# Patient Record
Sex: Female | Born: 1955 | Race: Asian | Hispanic: No | Marital: Married
Health system: Midwestern US, Community
[De-identification: ages and names within clinical notes are randomized; demographics above are authoritative.]

## PROBLEM LIST (undated history)

## (undated) DIAGNOSIS — E785 Hyperlipidemia, unspecified: Secondary | ICD-10-CM

## (undated) DIAGNOSIS — M8580 Other specified disorders of bone density and structure, unspecified site: Secondary | ICD-10-CM

## (undated) DIAGNOSIS — E559 Vitamin D deficiency, unspecified: Secondary | ICD-10-CM

## (undated) DIAGNOSIS — B181 Chronic viral hepatitis B without delta-agent: Secondary | ICD-10-CM

## (undated) DIAGNOSIS — M810 Age-related osteoporosis without current pathological fracture: Secondary | ICD-10-CM

## (undated) DIAGNOSIS — K643 Fourth degree hemorrhoids: Secondary | ICD-10-CM

## (undated) DIAGNOSIS — Z13820 Encounter for screening for osteoporosis: Secondary | ICD-10-CM

## (undated) DIAGNOSIS — Z1231 Encounter for screening mammogram for malignant neoplasm of breast: Secondary | ICD-10-CM

## (undated) DIAGNOSIS — Z01818 Encounter for other preprocedural examination: Secondary | ICD-10-CM

## (undated) DIAGNOSIS — N200 Calculus of kidney: Secondary | ICD-10-CM

## (undated) DIAGNOSIS — Z01810 Encounter for preprocedural cardiovascular examination: Secondary | ICD-10-CM

## (undated) DIAGNOSIS — Z1211 Encounter for screening for malignant neoplasm of colon: Secondary | ICD-10-CM

## (undated) DIAGNOSIS — N201 Calculus of ureter: Secondary | ICD-10-CM

## (undated) DIAGNOSIS — R319 Hematuria, unspecified: Secondary | ICD-10-CM

## (undated) DIAGNOSIS — R3129 Other microscopic hematuria: Secondary | ICD-10-CM

## (undated) DIAGNOSIS — R7989 Other specified abnormal findings of blood chemistry: Secondary | ICD-10-CM

## (undated) HISTORY — PX: BREAST LUMPECTOMY: SHX2

## (undated) HISTORY — PX: CHOLECYSTECTOMY, LAPAROSCOPIC: SHX56

## (undated) HISTORY — PX: ABDOMINAL HYSTERECTOMY: SHX81

## (undated) HISTORY — DX: Hyperlipidemia, unspecified: E78.5

## (undated) HISTORY — PX: CATARACT EXTRACTION: SUR2

## (undated) HISTORY — DX: Vitamin D deficiency, unspecified: E55.9

## (undated) HISTORY — DX: Chronic viral hepatitis B without delta-agent: B18.1

## (undated) HISTORY — DX: Other specified disorders of bone density and structure, unspecified site: M85.80

## (undated) HISTORY — PX: OTHER SURGICAL HISTORY: SHX169

---

## 2004-03-30 HISTORY — PX: COLONOSCOPY: SHX174

## 2008-04-30 LAB — LIPID PANEL
CHOL/HDL Ratio: 3 (ref 0–5.0)
Cholesterol, total: 188 MG/DL (ref 0–200)
HDL Cholesterol: 62 MG/DL — ABNORMAL HIGH (ref 40–60)
LDL, calculated: 93.4 MG/DL (ref 0–100)
LDL/HDL Ratio: 1.5
Triglyceride: 163 MG/DL — ABNORMAL HIGH (ref 0–150)
VLDL, calculated: 32.6 MG/DL

## 2008-04-30 LAB — METABOLIC PANEL, BASIC
Anion gap: 7 mmol/L (ref 5–15)
BUN/Creatinine ratio: 21 — ABNORMAL HIGH (ref 12–20)
BUN: 17 MG/DL (ref 7–18)
CO2: 29 MMOL/L (ref 21–32)
Calcium: 9.4 MG/DL (ref 8.4–10.4)
Chloride: 105 MMOL/L (ref 100–108)
Creatinine: 0.8 MG/DL (ref 0.6–1.3)
GFR est AA: >60 mL/min/{1.73_m2} (ref >60)
GFR est non-AA: >60 mL/min/{1.73_m2} (ref >60)
Glucose: 102 MG/DL — ABNORMAL HIGH (ref 74–99)
Potassium: 4.5 MMOL/L (ref 3.5–5.5)
Sodium: 141 MMOL/L (ref 136–145)

## 2008-04-30 LAB — HEPATIC FUNCTION PANEL
A-G Ratio: 1.1 (ref 0.8–1.7)
ALT (SGPT): 58 U/L (ref 30–65)
AST (SGOT): 28 U/L (ref 15–37)
Albumin: 4.2 g/dL (ref 3.4–5.0)
Alk. phosphatase: 71 U/L (ref 50–136)
Bilirubin, direct: 0.1 MG/DL (ref 0.0–0.3)
Bilirubin, total: 0.6 MG/DL (ref 0.1–0.9)
Globulin: 3.7 g/dL (ref 2.0–4.0)
Protein, total: 7.9 g/dL (ref 6.4–8.2)

## 2008-05-01 LAB — CBC WITH AUTOMATED DIFF
ABS. LYMPHOCYTES: 1.2 10*3/uL (ref 0.8–3.5)
ABS. MONOCYTES: 0.3 10*3/uL (ref 0–1.0)
ABS. NEUTROPHILS: 3.7 10*3/uL (ref 1.8–8.0)
BASOPHILS: 0 % (ref 0–3)
EOSINOPHILS: 1 % (ref 0–5)
HCT: 38.4 % (ref 36.0–46.0)
HGB: 12.3 g/dL (ref 12.0–16.0)
LYMPHOCYTES: 23 % (ref 20–51)
MCH: 30.7 PG (ref 25.0–35.0)
MCHC: 31.9 g/dL (ref 31.0–37.0)
MCV: 96 FL (ref 78.0–102.0)
MONOCYTES: 5 % (ref 2–9)
MPV: 7.7 FL (ref 7.4–10.4)
NEUTROPHILS: 71 % (ref 42–75)
PLATELET: 251 10*3/uL (ref 130–400)
RBC: 3.99 M/uL — ABNORMAL LOW (ref 4.10–5.10)
RDW: 13.5 % (ref 11.5–14.5)
WBC: 5.2 10*3/uL (ref 4.5–13.0)

## 2008-05-02 LAB — VITAMIN D, 25 HYDROXY: Vitamin D 25-Hydroxy: 30 ng/mL (ref 30–80)

## 2008-09-08 LAB — METABOLIC PANEL, COMPREHENSIVE
A-G Ratio: 1.2 (ref 0.8–1.7)
ALT (SGPT): 41 U/L (ref 30–65)
AST (SGOT): 21 U/L (ref 15–37)
Albumin: 4.4 g/dL (ref 3.4–5.0)
Alk. phosphatase: 76 U/L (ref 50–136)
Anion gap: 9 mmol/L (ref 5–15)
BUN/Creatinine ratio: 20 (ref 12–20)
BUN: 14 MG/DL (ref 7–18)
Bilirubin, total: 0.8 MG/DL (ref 0.1–0.9)
CO2: 30 MMOL/L (ref 21–32)
Calcium: 9 MG/DL (ref 8.4–10.4)
Chloride: 104 MMOL/L (ref 100–108)
Creatinine: 0.7 mg/dL (ref 0.6–1.3)
GFR est AA: >60 mL/min/{1.73_m2} (ref >60)
GFR est non-AA: >60 mL/min/{1.73_m2} (ref >60)
Globulin: 3.6 g/dL (ref 2.0–4.0)
Glucose: 99 MG/DL (ref 74–99)
Potassium: 5 MMOL/L (ref 3.5–5.5)
Protein, total: 8 g/dL (ref 6.4–8.2)
Sodium: 143 MMOL/L (ref 136–145)

## 2008-09-08 LAB — CBC WITH AUTOMATED DIFF
ABS. EOSINOPHILS: 0.1 10*3/uL (ref 0.0–0.4)
ABS. LYMPHOCYTES: 1.6 10*3/uL (ref 0.8–3.5)
ABS. MONOCYTES: 0.5 10*3/uL (ref 0–1.0)
ABS. NEUTROPHILS: 3.7 10*3/uL (ref 1.8–8.0)
BASOPHILS: 0 % (ref 0–3)
EOSINOPHILS: 2 % (ref 0–5)
HCT: 36.6 % (ref 36.0–46.0)
HGB: 12.5 g/dL (ref 12.0–16.0)
LYMPHOCYTES: 28 % (ref 20–51)
MCH: 31.8 PG (ref 25.0–35.0)
MCHC: 34.2 g/dL (ref 31.0–37.0)
MCV: 92.9 FL (ref 78.0–102.0)
MONOCYTES: 8 % (ref 2–9)
MPV: 7.4 FL (ref 7.4–10.4)
NEUTROPHILS: 62 % (ref 42–75)
PLATELET: 256 10*3/uL (ref 130–400)
RBC: 3.94 M/uL — ABNORMAL LOW (ref 4.10–5.10)
RDW: 13.2 % (ref 11.5–14.5)
WBC: 5.9 10*3/uL (ref 4.5–13.0)

## 2008-09-08 LAB — LIPID PANEL
CHOL/HDL Ratio: 3.1 (ref 0–5.0)
Cholesterol, total: 174 MG/DL (ref 0–200)
HDL Cholesterol: 57 MG/DL (ref 40–60)
LDL, calculated: 88.2 MG/DL (ref 0–100)
LDL/HDL Ratio: 1.5
Triglyceride: 144 MG/DL (ref 0–150)
VLDL, calculated: 28.8 MG/DL

## 2008-09-08 LAB — TSH 3RD GENERATION: TSH: 1.09 u[IU]/mL (ref 0.51–6.27)

## 2008-09-08 LAB — HEMOGLOBIN A1C WITH EAG: Hemoglobin A1c: 5.7 % (ref 4.8–6.0)

## 2008-09-09 LAB — VITAMIN D, 25 HYDROXY: Vitamin D 25-Hydroxy: 25 ng/mL — ABNORMAL LOW (ref 30–80)

## 2009-01-07 LAB — HEPATIC FUNCTION PANEL
A-G Ratio: 1.3 (ref 0.8–1.7)
ALT (SGPT): 38 U/L (ref 30–65)
AST (SGOT): 22 U/L (ref 15–37)
Albumin: 4.4 g/dL (ref 3.4–5.0)
Alk. phosphatase: 72 U/L (ref 50–136)
Bilirubin, direct: 0.2 MG/DL (ref 0.0–0.3)
Bilirubin, total: 1 MG/DL — ABNORMAL HIGH (ref 0.1–0.9)
Globulin: 3.5 g/dL (ref 2.0–4.0)
Protein, total: 7.9 g/dL (ref 6.4–8.2)

## 2009-01-07 LAB — METABOLIC PANEL, BASIC
Anion gap: 7 mmol/L (ref 5–15)
BUN/Creatinine ratio: 16 (ref 12–20)
BUN: 13 MG/DL (ref 7–18)
CO2: 30 MMOL/L (ref 21–32)
Calcium: 9.3 MG/DL (ref 8.4–10.4)
Chloride: 103 MMOL/L (ref 100–108)
Creatinine: 0.8 MG/DL (ref 0.6–1.3)
GFR est AA: >60 mL/min/{1.73_m2} (ref >60)
GFR est non-AA: >60 mL/min/{1.73_m2} (ref >60)
Glucose: 97 MG/DL (ref 74–99)
Potassium: 4.6 MMOL/L (ref 3.5–5.5)
Sodium: 140 MMOL/L (ref 136–145)

## 2009-01-07 LAB — LIPID PANEL
CHOL/HDL Ratio: 2.9 (ref 0–5.0)
Cholesterol, total: 170 MG/DL (ref 0–200)
HDL Cholesterol: 58 MG/DL (ref 40–60)
LDL, calculated: 89.6 MG/DL (ref 0–100)
LDL/HDL Ratio: 1.5
Triglyceride: 112 MG/DL (ref 0–150)
VLDL, calculated: 22.4 MG/DL

## 2010-03-30 HISTORY — PX: OTHER SURGICAL HISTORY: SHX169

## 2011-01-05 ENCOUNTER — Encounter

## 2014-01-05 ENCOUNTER — Encounter: Payer: PRIVATE HEALTH INSURANCE | Primary: Physician Assistant

## 2014-01-11 ENCOUNTER — Inpatient Hospital Stay: Admit: 2014-01-11 | Payer: PRIVATE HEALTH INSURANCE | Primary: Physician Assistant

## 2014-01-11 DIAGNOSIS — M25511 Pain in right shoulder: Secondary | ICD-10-CM

## 2014-01-11 NOTE — Progress Notes (Signed)
PHYSICAL THERAPY - DAILY TREATMENT NOTE  Patient Name: Darlene Espinoza        Date: 01/11/2014  DOB: [birth year]     Patient DOB Verified  Visit #:   1   of   6  Insurance: Payor: OPTIMA / Plan: [place] / Product Type: HMO /      In time:   2:00          Out time:   3:00 Total Treatment Time (min):   60   Medicare Time Tracking (below)   Total Timed Codes (min):  60 1:1 Treatment Time:  60     SUBJECTIVE    Pain Level (on 0 to 10 scale):  0  / 10   Medication Changes/New allergies or changes in medical history, any new surgeries or procedures?     No      Yes   If yes, update Summary List:    Subjective Functional Status/Changes:    No changes reported   HISTORY    Present Symptoms: R Shoulder pain x 3 months. X-rays (+) for OA. Went to specialist, who informed her that she has a frozen shoulder    Present since: 3 months   Improving   Unchanging   Worsening          Commenced as a result of:    or   No apparent reason    Symptoms at onset: Pain to top of shoulder    Constant symptoms: R shoulder discomfort, intolerance towards elevation.     Intermittent symptoms: sharp pain with end range     What produces or worsens: elevation    What stops or reduces: rest.     Continued use makes the pain:   Better   Worse   No effect     Disturbed night:  No     Yes    Pain at rest:  No     Yes     Not since shot  Treatments this episode: Received injection 12/25/13    Previous episodes: 2010, had a chopping board fall onto shoulder. Had on/off pain since to top of shoulder.     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     Leisure: Mechanical Stresses: sedentary, stationary bike, cooking.     Functional disability from present episode: intolerance towards elevation.     Functional disability score: See QD    Summary:   Acute   Sub-acute   Chronic       Trauma   Insidious onset        OBJECTIVE      Physical Therapy Evaluation - Shoulder    Posture:  Poor     Fair     Good    Describe:    ROM:   Unable to assess at this time                                           AROM  PROM   Left Right  Right   Flexion 160 110 Flexion 108   Extension 80 25 Extension 28   Scaption/ABD 158 55 Scaption/ABD 57   ER @ 0 Degrees 90 20  ER @ 0 Degrees 28   ER @ 90 Degrees 110 unable ER @ 90 Degrees NT   IR @ 90 Degrees 70 unable IR @ 90 Degrees NT   IR HBB T4 unable IR HBB NT     End Feel / Painful Arc: firm at end ranges        Strength:    Unable to assess at this time                                                                            L (1-5) R (1-5) Pain   Flexors 5   Yes    No   Abductors 5   Yes    No   External Rotators 5   Yes    No   Internal Rotators 5   Yes    No   Supraspinatus 5   Yes    No   Serratus Anterior 5   Yes    No   Lower Trapezius 5   Yes    No   Elbow Flexion 5   Yes    No   Elbow Extension 5   Yes    No       Scapulohumoral Control / Rhythm:poor    Accessory Motions: UT and trunk lean    Able to eccentrically lower with good control?       Palpation   Min   Mod   Severe    Location:supraspinatus   Min   Mod   Severe    Location:anterolateral shoulder   Min   Mod   Severe    Location:    Optional Tests:    Sensation Left Right Reflexes Left Right   Biceps (C5)   Biceps (C5)     Palmer Radial(C6-7)   Brachioradialis (C6)     Palmer Ulnar(C8-T1)   Triceps (C7)       Crossover Imp.  Pos    Neg  Yergason's Test  Pos    Neg  Roo's Test   Pos    Neg  Gilchrist's Sign  Pos    Neg  Neer's Test   Pos    Neg  Clunk Test   Pos    Neg  Hawkin's Test   Pos    Neg  AC Joint   Pos    Neg  Speed's Test   Pos    Neg SC Joint   Pos    Neg  Empty Can   Pos    Neg Pectoral Tightness  Pos    Neg  Anterior Apprehension  Pos    Neg   Posterior Apprehension  Pos    Neg    Other Tests / Comments:      Modality Rationale:       min  Estim, type/location:  att       unatt       w/US       w/ice      w/heat    min   Mechanical Traction: type/lbs                     pro     sup     int     cont      before manual      after manual    min   Ultrasound, settings/location:      min   Iontophoresis w/ dexamethasone, location:                                                 take home patch         in clinic   10 min   Ice       Heat    location/position: supine    min   Vasopneumatic Device, press/temp:     min   Other:     Skin assessment post-treatment (if applicable):      intact      redness- no adverse reaction     redness ??? adverse reaction:      23 min Therapeutic Exercise:    See flow sheet   Rationale:      increase ROM and increase strength to improve the patient???s return to function     10 min Manual Therapy: STM to periscapular mm; GH G3 GH Distraction; G3-4 inferior/posterior mobs;  PROM all planes; Scapular PNF; Shoulder PNF Patterns; Rhythmic Stabilization.      Rationale:      decrease pain, increase ROM and increase tissue extensibility to improve patient's return to function     min Patient Education:  YES  Reviewed HEP     Progressed/Changed HEP based on:        Other Objective/Functional Measures:    See above     Post Treatment Pain Level (on 0 to 10) scale:   0  / 10     ASSESSMENT    Assessment/Changes in Function:     Provisional Classification:      Peripheral   Spine     Dysfunction:   Articular: shoulder  Contractile:  Derangement:  Postural:  Uncertain:  Other:       See Progress Note/Recertification   Patient will continue to benefit from skilled PT services to modify and progress therapeutic interventions, address functional mobility deficits, address ROM deficits, address strength deficits, analyze and address soft tissue restrictions, analyze and cue movement patterns, analyze and modify  body mechanics/ergonomics and instruct in home and community integration to attain remaining goals.   Progress toward goals / Updated goals:    n/a     PLAN       Upgrade activities as tolerated YES Continue plan of care     Discharge due to :      Other: Initiate POC     Therapist: Quentin Ore, PT, Cert. MDT    Date: 01/11/2014 Time: 2:04 PM

## 2014-01-11 NOTE — Progress Notes (Signed)
[facility] ??? [facility]  [address], [place], [place] [id number] - Phone: [phone]  Fax: [phone]  PLAN OF CARE / STATEMENT OF MEDICAL NECESSITY FOR PHYSICAL THERAPY SERVICES  Patient Name: Darlene Espinoza DOB: 06-08-1955   Medical   Diagnosis: Right shoulder pain [M25.511]  Rotator cuff syndrome, right [M75.101] Treatment Diagnosis: R RTC impingement, Adhesive capsulitis   Onset Date: ~July 2015     Referral Source: Stephani PoliceButkovich, Bradley T, MD Start of Care [facility]): 01/11/2014   Prior Hospitalization: See medical history Provider #: [id number]   Prior Level of Function: Independent and working pain free   Comorbidities: High cholesterol.    Medications: Verified on Patient Summary List   The Plan of Care and following information is based on the information from the initial evaluation.   ===========================================================================================  Assessment / key information: Patient is a 58 y.o. female with complaints of chronic shoulder pain x 3-4 months. She reports after injection to her shoulder, the consistency of pain had reduced significantly. Patient was unable to schedule for PT as she was in the process of moving. Patient currently demonstrates the following objective measures:            AROM               PROM   Left  Right   Right    Flexion  160  110  Flexion  108    Extension  80  25  Extension  28    Scaption/ABD  158  55  Scaption/ABD  57    ER @ 0 Degrees  90  20   ER @ 0 Degrees  28    ER @ 90 Degrees  110  unable  ER @ 90 Degrees  NT    IR @ 90 Degrees  70  unable  IR @ 90 Degrees  NT    IR HBB  T4  unable  IR HBB  NT    Patient demonstrates firm end feel at end ranges of her limitations. MMT to shoulder girdle is biased due to discomfort/pain to shoulder. Pt would benefit from skilled PT services to address her impairments (listed below),  educate her, and improve her level of function. Thanks for your referral.    ===========================================================================================  Problem List: pain affecting function, decrease ROM, decrease strength, decrease ADL/ functional abilitiies, decrease activity tolerance, decrease flexibility/ joint mobility and decrease transfer abilities   Treatment Plan may include any combination of the following: Therapeutic exercise, Therapeutic activities, Neuromuscular re-education, Physical agent/modality, Manual therapy, Patient education and Other: Dry Needling  Patient / Family readiness to learn indicated by: asking questions, trying to perform skills and interest  Persons(s) to be included in education: patient (P)  Barriers to Learning/Limitations: yes;  Financial, poor pain tolerance, and work schedule,    Measures taken: Patient to emphasize independence in HEP   Patient Goal (s): "Learn the exercises so I can save money"   Patient self reported health status: good  Rehabilitation Potential: good  ? Long Term Goals: To be accomplished in  3  weeks:  1. Patient to be Independent with HEP to self-manage/prevent symptoms after DC.  2. Patient to demonstrate AROM flexion to > 135 degrees to facilitate OH reach  3. Patient to increase HBB IR to PSIS to facilitate hygiene   Frequency / Duration:   Patient to be seen  2  times per week for 3  weeks:  Patient /  Caregiver education and instruction: activity modification and exercises. We reviewed our facility's Patient Personal Responsibilities form, particularly in regards to our appointment time, attendance policy and compliance towards her home exercise program. We also discussed her POC as deemed appropriate by the treating therapist and physician. Patient verbalized understanding that she must show objective and functional improvement in an appropriate time frame. Patient verbalized understanding that should progress be lacking, we will contact the referring physician  for further consultation to address and attempt to establish alternate treatment strategies as necessary or possibly discharge.  G-Codes (GP): n/a   Therapist Signature: Keighan Amezcua "BJ" Cori Henningsen, DPT, Cert. MDT, Cert. DN Date: 01/11/2014   Certification Period: n/a Time: 2:11 PM   ===========================================================================================  I certify that the above Physical Therapy Services are being furnished while the patient is under my care.  I agree with the treatment plan and certify that this therapy is necessary.    Physician Signature:        Date:       Time:     Please sign and return to [facility] or you may fax the signed copy to [phone].  Thank you.

## 2014-01-16 ENCOUNTER — Inpatient Hospital Stay: Admit: 2014-01-16 | Payer: PRIVATE HEALTH INSURANCE | Primary: Physician Assistant

## 2014-01-16 NOTE — Progress Notes (Signed)
PHYSICAL THERAPY - DAILY TREATMENT NOTE    Patient Name: Darlene Espinoza        Date: 01/16/2014  DOB: December 08, 1955   YES Patient DOB Verified  Visit #:  2   of   6  Insurance: Payor: OPTIMA / Plan: [place] OPT[place] HMO / Product Type: HMO /      In time: 9:30 Out time: 10:20   Total Treatment Time: 50     Medicare Time Tracking (below)   Total Timed Codes (min):  N/A 1:1 Treatment Time:  N/A     TREATMENT AREA =  R shoulder    SUBJECTIVE    Pain Level (on 0 to 10 scale):  5  / 10   Medication Changes/New allergies or changes in medical history, any new surgeries or procedures?    NO    If yes, update Summary List   Subjective Functional Status/Changes:    No changes reported     "I have to go back to work after I leave here."         OBJECTIVE    Modalities Rationale:  10     min  Estim, type/location:                                        att       unatt       w/US       w/ice      w/heat    min   Mechanical Traction: type/lbs                     pro     sup     int     cont      before manual      after manual    min   Ultrasound, settings/location:      min   Iontophoresis w/ dexamethasone, location:                                                 take home patch         in clinic   10 min   Ice       Heat    location/position: supine    min   Vasopneumatic Device, press/temp:     min   Other:     Skin assessment post-treatment (if applicable):      intact      redness- no adverse reaction     redness ??? adverse reaction:      30 min Therapeutic Exercise:    See flow sheet   Rationale:      increase ROM, increase strength and improve coordination to improve the patient???s ability to perform work duties with min to no pain        10 min Manual Therapy: PROM to R shoulder all planes   Rationale:  decrease pain and increase ROM to improve patient's ability to improve functional mobiltiy.        min Patient Education:  YES  Reviewed HEP     Progressed/Changed HEP based on:        Other Objective/Functional Measures:     Added wand ER, abd and finger ladder  Post Treatment Pain Level (on 0 to 10) scale:  3  / 10     ASSESSMENT    Assessment/Changes in Function:     Patient guarded with PROM secondary to pain in R shoulder.    Patient does require VCS to decrease shoulder hike with exercises       SeeProgress Note/Recertification   Patient will continue to benefit from skilled PT services to modify and progress therapeutic interventions, address functional mobility deficits, address ROM deficits, address strength deficits, analyze and cue movement patterns, analyze and modify body mechanics/ergonomics and instruct in home and community integration to attain remaining goals.     Progress toward goals / Updated goals:    ongoing     PLAN      Upgrade activities as tolerated YES Continue plan of care     Discharge due to :      Other:      Therapist: Gaylene BrooksAmy  Randolph Shellhammer, PTA    Date: 01/16/2014 Time: 12:56 PM     Future Appointments  Date Time Provider Department Center   01/18/2014 8:30 AM Aicia Babinski Demetrio LappingGilman, PTA [facility]   01/23/2014 8:30 AM Kemaria Dedic Demetrio LappingGilman, PTA [facility]   01/26/2014 11:30 AM Quentin Oreave Aznar, PT [facility]   01/29/2014 11:00 AM Quentin Oreave Aznar, PT [facility]   02/02/2014 10:30 AM Quentin Oreave Aznar, PT [facility]   02/05/2014 11:00 AM Quentin Oreave Aznar, PT [facility]   02/09/2014 10:30 AM Quentin Oreave Aznar, PT [facility]   02/12/2014 11:30 AM Quentin Oreave Aznar, PT [facility]   02/16/2014 10:30 AM Quentin Oreave Aznar, PT [facility]   02/19/2014 11:30 AM Quentin Oreave Aznar, [facility]

## 2014-01-18 ENCOUNTER — Inpatient Hospital Stay: Admit: 2014-01-18 | Payer: PRIVATE HEALTH INSURANCE | Primary: Physician Assistant

## 2014-01-18 NOTE — Progress Notes (Signed)
PHYSICAL THERAPY - DAILY TREATMENT NOTE    Patient Name: Darlene Espinoza        Date: 01/18/2014  DOB: Nov 01, 1955   YES Patient DOB Verified  Visit #:   3  of   6  Insurance: Payor: OPTIMA / Plan: [place] OPT[place] HMO / Product Type: HMO /      In time: 8:30 Out time: 9:10   Total Treatment Time: 40     Medicare Time Tracking (below)   Total Timed Codes (min):  N/A 1:1 Treatment Time: N/A     TREATMENT AREA =  R shoulder    SUBJECTIVE    Pain Level (on 0 to 10 scale):  2  / 10   Medication Changes/New allergies or changes in medical history, any new surgeries or procedures?    NO    If yes, update Summary List   Subjective Functional Status/Changes:    No changes reported     "Not so painful this morning, because I haven't moved much."         OBJECTIVE    Modalities Rationale      min  Estim, type/location:                                        att       unatt       w/US       w/ice      w/heat    min   Mechanical Traction: type/lbs                     pro     sup     int     cont      before manual      after manual    min   Ultrasound, settings/location:      min   Iontophoresis w/ dexamethasone, location:                                                 take home patch         in clinic   10 min   Ice       Heat    location/position: supine    min   Vasopneumatic Device, press/temp:     min   Other:     Skin assessment post-treatment (if applicable):      intact      redness- no adverse reaction     redness ??? adverse reaction:      20 min Therapeutic Exercise:    See flow sheet   Rationale:      increase ROM and increase strength to improve the patient???s ability to return to work duties with min to no pain.        10 min Manual Therapy: PROM all planes     Rationale:  decrease pain and increase ROM to improve patient's ability to        min Patient Education:  YES  Reviewed HEP     Progressed/Changed HEP based on:        Other Objective/Functional Measures:     Patient challenged with shoulder ER exercises secondary to pain.      Post Treatment Pain  Level (on 0 to 10) scale:  2  / 10     ASSESSMENT    Assessment/Changes in Function:     PROM of R shoulder flexion 115 degrees, scaption: 78 degrees, ER at 0= 32 degrees, IR at 45 degrees = 18 degrees    Patient continues to c/o pain with AAROM exercises and requires VCS to decrease guarding with PROM       See Progress Note/Recertification   Patient will continue to benefit from skilled PT services to modify and progress therapeutic interventions, address functional mobility deficits, address ROM deficits, address strength deficits, analyze and modify body mechanics/ergonomics, address imbalance/dizziness and instruct in home and community integration to attain remaining goals.     Progress toward goals / Updated goals:    ongoing     PLAN      Upgrade activities as tolerated YES Continue plan of care     Discharge due to :      Other:      Therapist: Gaylene BrooksAmy  Monnie Gudgel, PTA    Date: 01/18/2014 Time: 8:55 AM     Future Appointments  Date Time Provider Department Center   01/23/2014 8:30 AM Lashawn Orrego Demetrio LappingGilman, PTA [facility]   01/26/2014 11:30 AM Quentin Oreave Aznar, PT [facility]   01/29/2014 11:00 AM Quentin Oreave Aznar, PT [facility]   02/02/2014 10:30 AM Quentin Oreave Aznar, PT [facility]   02/05/2014 11:00 AM Quentin Oreave Aznar, PT [facility]   02/09/2014 10:30 AM Quentin Oreave Aznar, PT [facility]   02/12/2014 11:30 AM Quentin Oreave Aznar, PT [facility]   02/16/2014 10:30 AM Quentin Oreave Aznar, PT [facility]   02/19/2014 11:30 AM Quentin Oreave Aznar, PT [facility]

## 2014-01-23 ENCOUNTER — Inpatient Hospital Stay: Admit: 2014-01-23 | Payer: PRIVATE HEALTH INSURANCE | Primary: Physician Assistant

## 2014-01-23 NOTE — Progress Notes (Signed)
PHYSICAL THERAPY - DAILY TREATMENT NOTE    Patient Name: Darlene Espinoza        Date: 01/23/2014  DOB: [birth year]   YES Patient DOB Verified  Visit #:   4  of   6  Insurance: Payor: OPTIMA / Plan: [place] OPTIM[place] HMO / Product Type: HMO /      In time: 8:25 Out time: 9:15   Total Treatment Time: 50     Medicare Time Tracking (below)   Total Timed Codes (min): N/A 1:1 Treatment Time:  N/A     TREATMENT AREA =  R shoulder     SUBJECTIVE    Pain Level (on 0 to 10 scale):  4  / 10   Medication Changes/New allergies or changes in medical history, any new surgeries or procedures?    NO    If yes, update Summary List   Subjective Functional Status/Changes:    No changes reported     "The doctor said she doesn't want to do a MRI. Said she would give me another shot. I don't want it."             OBJECTIVE    Modalities Rationale:        min  Estim, type/location:                                        att       unatt       w/US       w/ice      w/heat    min   Mechanical Traction: type/lbs                     pro     sup     int     cont      before manual      after manual    min   Ultrasound, settings/location:      min   Iontophoresis w/ dexamethasone, location:                                                 take home patch         in clinic   10 min   Ice       Heat    location/position: Supine R shoulder    min   Vasopneumatic Device, press/temp:     min   Other:     Skin assessment post-treatment (if applicable):      intact      redness- no adverse reaction     redness ??? adverse reaction:      30 min Therapeutic Exercise:    See flow sheet   Rationale:      increase ROM and increase strength to improve the patient???s ability to perform work activities with min to no pain      10 min Manual Therapy: PROM and jt mobs grade II to R shoulder    Rationale:  Increase ROM and decrease pain to improve patient's ability to perform functional activities with min to no pain.       min Patient Education:  YES  Reviewed HEP      Progressed/Changed HEP based on:  Other Objective/Functional Measures:    Added wall wash and UBEthis session.   Added Tband IR and ER with yellow t-band  Added S/L ER of R shoulder     Post Treatment Pain Level (on 0 to 10) scale:  2  / 10     ASSESSMENT    Assessment/Changes in Function:     Patient able to tolerate new TE this session. Patient challenged with ER of R shoulder with yellow tband    Patient continues to be limited with R shoulder flexion <120 degrees and continues to c/o pain with active abduction.       See Progress Note/Recertification   Patient will continue to benefit from skilled PT services to modify and progress therapeutic interventions, address functional mobility deficits, address ROM deficits, address strength deficits, analyze and modify body mechanics/ergonomics, assess and modify postural abnormalities and instruct in home and community integration to attain remaining goals.     Progress toward goals / Updated goals:    ongoing     PLAN      Upgrade activities as tolerated YES Continue plan of care     Discharge due to :      Other:      Therapist: Gaylene BrooksAmy  Elidia Bonenfant, PTA    Date: 01/23/2014 Time: 8:31 AM     Future Appointments  Date Time Provider Department Center   01/26/2014 11:30 AM Quentin Oreave Aznar, PT [facility]   01/29/2014 11:00 AM Quentin Oreave Aznar, PT [facility]   02/02/2014 10:30 AM Quentin Oreave Aznar, PT [facility]   02/05/2014 11:00 AM Quentin Oreave Aznar, [facility]   02/09/2014 10:30 AM Quentin Oreave Aznar, PT [facility]   02/12/2014 11:30 AM Quentin Oreave Aznar, PT [facility]   02/16/2014 10:30 AM Quentin Oreave Aznar, PT [facility]   02/19/2014 11:30 AM Quentin Oreave Aznar, PT [facility]

## 2014-01-26 ENCOUNTER — Inpatient Hospital Stay: Admit: 2014-01-26 | Payer: PRIVATE HEALTH INSURANCE | Primary: Physician Assistant

## 2014-01-26 NOTE — Progress Notes (Signed)
PHYSICAL THERAPY - DAILY TREATMENT NOTE    Patient Name: Darlene Espinoza        Date: 01/26/2014  DOB: 06-Nov-1955   YES Patient DOB Verified  Visit #:   5   of   6  Insurance: Payor: OPTIMA / [place] / Product Type: HMO /      In time: 10:50 Out time: 11:40   Total Treatment Time: 50     TREATMENT AREA = Right shoulder pain [M25.511]  Rotator cuff syndrome, right [M75.101]    SUBJECTIVE    Pain Level (on 0 to 10 scale):  3  / 10   Medication Changes/New allergies or changes in medical history, any new surgeries or procedures?    NO    If yes, update Summary List   Subjective Functional Status/Changes:    No changes reported     "It's better but still hurts and with very little (ROM)"          OBJECTIVE    30 min Therapeutic Exercise:    See flow sheet   Rationale:      increase ROM and increase strength      10 min Manual Therapy: STM to periscapular mm; GH G3 GH Distraction; G3-4 inferior/posterior mobs;  PROM all planes; Scapular PNF; Shoulder PNF Patterns; Rhythmic Stabilization.      Rationale:      decrease pain, increase ROM and increase tissue extensibility     Modalities Rationale: Prophylaxis/Palliative    min  Estim, type/location:                                       att       unatt       w/US       w/ice      W/heat      before manual      after manual    min   Mechanical Traction: type/lbs                     pro     sup     int     cont      before manual      after manual    min   Ultrasound, settings/location:      min   Iontophoresis w/ dexamethasone, location:                                                 take home patch         in clinic   10 min   Ice       Heat    Position/location: supine    min   Vasopneumatic Device, press/temp:     min   Other:     Skin assessment post-treatment (if applicable):      intact      redness- no adverse reaction     redness ??? adverse reaction:       min Patient Education:  YES   Reviewed HEP     Progressed/Changed HEP based on:         Other Objective/Functional Measures:    Increased reps/sets/resistance per flow sheet.       Post  Treatment Pain Level (on 0 to 10) scale:   2  / 10     ASSESSMENT    Assessment/Changes in Function:     Tolerated therex progression well       See Progress Note/Recertification   Patient will continue to benefit from skilled PT services to modify and progress therapeutic interventions, address functional mobility deficits, address ROM deficits, address strength deficits, analyze and address soft tissue restrictions, analyze and cue movement patterns, analyze and modify body mechanics/ergonomics and instruct in home and community integration  to attain remaining goals   Progress toward goals / Updated goals:    Ongoing, progressing towards set goals.       PLAN      Upgrade activities as tolerated YES Continue plan of care     Discharge due to :      Other:      Therapist: Theodoro Gristave "BJ" Vern ClaudeAznar, DPT, Cert. MDT, Cert. DN    Date: 01/26/2014 Time: 11:49 AM   Future Appointments  Date Time Provider Department Center   01/29/2014 11:00 AM Quentin OreDave Layn Kye, PT [facility]   02/02/2014 10:30 AM Quentin Oreave Dontavis Tschantz, PT [facility]   02/05/2014 11:00 AM Quentin Oreave Luara Faye, PT [facility]   02/09/2014 10:30 AM Quentin Oreave Rylah Fukuda, PT [facility]   02/12/2014 11:30 AM Quentin Oreave Livvy Spilman, PT [facility]   02/16/2014 10:30 AM Quentin Oreave Shalik Sanfilippo, PT [facility]   02/19/2014 11:30 AM Quentin Oreave Rasool Rommel, PT [facility]

## 2014-01-29 ENCOUNTER — Inpatient Hospital Stay: Admit: 2014-01-29 | Payer: PRIVATE HEALTH INSURANCE | Primary: Physician Assistant

## 2014-01-29 DIAGNOSIS — M25511 Pain in right shoulder: Secondary | ICD-10-CM

## 2014-01-29 NOTE — Progress Notes (Signed)
PHYSICAL THERAPY - DAILY TREATMENT NOTE    Patient Name: Darlene Espinoza        Date: 01/29/2014  DOB: [birth year]   YES Patient DOB Verified  Visit #:   6   of   6+  Insurance: Payor: OPTIMA / [place] / Product Type: HMO /      In time: 10:50 Out time: 11:45   Total Treatment Time: 55     TREATMENT AREA = Right shoulder pain [M25.511]  Rotator cuff syndrome, right [M75.101]    SUBJECTIVE    Pain Level (on 0 to 10 scale):  2  / 10   Medication Changes/New allergies or changes in medical history, any new surgeries or procedures?    NO    If yes, update Summary List   Subjective Functional Status/Changes:    No changes reported     "The doctor said I didn't need to do an MRI. He said I didn't have to come back to him anymore unless I wanted a shot. Otherwise continue with my exercises. I'd like to try another week before discharging.  "          OBJECTIVE    35 min Therapeutic Exercise:    See flow sheet   Rationale:      increase ROM and increase strength      10 min Manual Therapy: STM to periscapular mm; GH G3 GH Distraction; G3-4 inferior/posterior mobs;  PROM all planes; Scapular PNF; Shoulder PNF Patterns; Rhythmic Stabilization.      Rationale:      decrease pain, increase ROM and increase tissue extensibility     Modalities Rationale: Prophylaxis/Palliative    min  Estim, type/location:                                       att       unatt       w/US       w/ice      W/heat      before manual      after manual    min   Mechanical Traction: type/lbs                     pro     sup     int     cont      before manual      after manual    min   Ultrasound, settings/location:      min   Iontophoresis w/ dexamethasone, location:                                                 take home patch         in clinic   10 min   Ice       Heat    Position/location: Supine, R Shoulder    min   Vasopneumatic Device, press/temp:     min   Other:     Skin assessment post-treatment (if applicable):       intact      redness- no adverse reaction     redness ??? adverse reaction:       min Patient Education:  YES   Reviewed HEP  Progressed/Changed HEP based on:        Other Objective/Functional Measures:                                                 R Shoulder AROM PROM Strength   Flexion 130 132 4   Extension 60 63 4   Scaption/ABD 115/80 120/83 4   ER @ 0 Degrees 37 45 4     HBB = L5       Post Treatment Pain Level (on 0 to 10) scale:   1  / 10     ASSESSMENT    Assessment/Changes in Function:     See PN       See Progress Note/Recertification   Patient will continue to benefit from skilled PT services to modify and progress therapeutic interventions, address functional mobility deficits, address ROM deficits, address strength deficits, analyze and address soft tissue restrictions, analyze and cue movement patterns, analyze and modify body mechanics/ergonomics and instruct in home and community integration  to attain remaining goals   Progress toward goals / Updated goals:    See PN     PLAN      Upgrade activities as tolerated YES Continue plan of care     Discharge due to :      Other:      Therapist: Theodoro Gristave "BJ" Vern ClaudeAznar, DPT, Cert. MDT, Cert. DN    Date: 01/29/2014 Time: 11:09 AM   Future Appointments  Date Time Provider Department Center   02/02/2014 10:30 AM Quentin OreDave Syenna Nazir, PT [facility]   02/05/2014 11:00 AM Quentin Oreave Niaja Stickley, PT [facility]   02/09/2014 10:30 AM Quentin Oreave Eliam Snapp, PT [facility]   02/12/2014 11:30 AM Quentin Oreave Chaniqua Brisby, PT [facility]   02/16/2014 10:30 AM Quentin Oreave Nickalous Stingley, PT [facility]   02/19/2014 11:30 AM Quentin Oreave Ellery Meroney, PT [facility]

## 2014-01-29 NOTE — Progress Notes (Signed)
[place] ??? [facility] PHYSICAL THERAPY  [address] [place] [address] - Phone: [phone]  Fax: [phone]  PROGRESS NOTE  Patient Name: Darlene Espinoza DOB: 1956/02/11   Treatment/Medical Diagnosis: Right shoulder pain [M25.511]  Rotator cuff syndrome, right [M75.101]   Referral Source: Earney Mallet, MD     Date of Initial Visit: 01/11/14 Attended Visits: 6 Missed Visits: 0     SUMMARY OF TREATMENT  Patient's POC has consisted of therex, therapeutic activities, manual therapy prn, modalities prn, pt. education, and a comprehensive HEP. Treatment strategies used to address functional mobility deficits, ROM deficits, strength deficits, analyze and address soft tissue restrictions, analyze and cue movement patterns, analyze and modify body mechanics/ergonomics, assess and modify postural abnormalities and instruct in home and community integration.     CURRENT STATUS  Patient demonstrates the following objective measures:    R Shoulder  AROM  PROM  Strength    Flexion  130  132  4    Extension  60  63  4    Scaption/ABD  115/80  120/83  4    ER @ 0 Degrees  37  45  4      HBB = L5    Goal/Measure of Progress Goal Met?   ?? GOALS  1. Patient to be Independent with HEP to self-manage/prevent symptoms after DC.  2. Patient to demonstrate AROM flexion to > 135 degrees to facilitate OH reach  3. Patient to increase HBB IR to PSIS to facilitate hygiene    Ongoing    Not MET    MET     New Goals to be achieved in __2-3__  weeks:  1. Patient to be Independent with HEP to self-manage/prevent symptoms after DC.  2. Patient to demonstrate AROM flexion to > 135 degrees to facilitate OH reach    Frequency / Duration:   Patient to be seen  1-2  times per week for 2-3  weeks:    G-Codes: n/a  RECOMMENDATIONS  Patient reports she wishes to continue "a few more sessions and DC" due to work schedule conflict and finances. Patient's remaining sessions to  transition to independence with HEP.    If you have any questions/comments please contact us directly at [phone].   Thank you for allowing [place] to assist in the care of your patient.    Therapist Signature: Waunita Schooner "BJ" Seneca Hoback, DPT, Cert. MDT, Cert. DN Date: 29/07/2839   Certification Period: n/a     Reporting Period n/a   Time: 11:41 AM   NOTE TO PHYSICIAN:  PLEASE COMPLETE THE ORDERS BELOW AND FAX TO   [facility]: [phone].  If you are unable to process this request in 24 hours please contact our office: [phone].    ___ I have read the above report and request that my patient continue as recommended.   ___ I have read the above report and request that my patient continue therapy with the following changes/special instructions:_________________________________________________________   ___ I have read the above report and request that my patient be discharged from therapy.     Physician Signature:        Date:       Time:

## 2014-02-02 ENCOUNTER — Encounter: Payer: PRIVATE HEALTH INSURANCE | Primary: Physician Assistant

## 2014-02-05 ENCOUNTER — Inpatient Hospital Stay: Admit: 2014-02-05 | Payer: PRIVATE HEALTH INSURANCE | Primary: Physician Assistant

## 2014-02-05 NOTE — Progress Notes (Signed)
[place] ??? [facility] PHYSICAL THERAPY  [address] [place] [address] - Phone: [phone]  Fax: [phone]  DISCHARGE SUMMARY  Patient Name: Darlene Espinoza DOB: 1955/11/26   Treatment/Medical Diagnosis: Right shoulder pain [M25.511]  Rotator cuff syndrome, right [M75.101]   Referral Source: Earney Mallet, MD     Date of Initial Visit: 01/11/14 Attended Visits: 7 Missed Visits: 1 week   CURRENT STATUS  Patient attended 1 visit since last PN. In order to save costs and time, patient agreed to DC with HEP at this time.    GOALS/MEASURE OF PROGRESS Goal Met?   1. Patient to be Independent with HEP to self-manage/prevent symptoms after DC.  2. Patient to demonstrate AROM flexion to > 135 degrees to facilitate OH reach MET    MET       G-Codes: n/a  RECOMMENDATIONS  Discontinue therapy. Progressing towards or have reached established goals.    If you have any questions/comments please contact us directly at [phone].   Thank you for allowing [place] to assist in the care of your patient.    Therapist Signature: Darlene Espinoza, DPT, Cert. MDT, Cert. DN Date: 54/0/08   Certification Period n/a Time: 1:49 PM     NOTE TO PHYSICIAN:  PLEASE COMPLETE THE ORDERS BELOW AND FAX TO   [facility]: [phone]  If you are unable to process this request in 24 hours please contact our office: [phone]    ___ I have read the above report and request that my patient continue as recommended.   ___ I have read the above report and request that my patient continue therapy with the following changes/special instructions:_________________________________________________________   ___ I have read the above report and request that my patient be discharged from therapy.     Physician Signature:        Date:     Time:

## 2014-02-05 NOTE — Progress Notes (Signed)
PHYSICAL THERAPY - DAILY TREATMENT NOTE    Patient Name: Darlene Espinoza        Date: 02/05/2014  DOB: 1955-09-14   YES Patient DOB Verified  Visit #:   7   of   6+  Insurance: Payor: OPTIMA / [place] / Product Type: HMO /      In time: 11:10 Out time: 11:50   Total Treatment Time: 40     TREATMENT AREA = Right shoulder pain [M25.511]  Rotator cuff syndrome, right [M75.101]    SUBJECTIVE    Pain Level (on 0 to 10 scale):  0  / 10   Medication Changes/New allergies or changes in medical history, any new surgeries or procedures?    NO    If yes, update Summary List   Subjective Functional Status/Changes:    No changes reported     "It doesn't hurt until I reach. I'm feeling like I can do this on my own. "          OBJECTIVE    30 min Therapeutic Exercise:    See flow sheet   Rationale:      increase ROM and increase strength      Modalities Rationale: Prophylaxis/Palliative    min  Estim, type/location:                                       att       unatt       w/US       w/ice      W/heat      before manual      after manual    min   Mechanical Traction: type/lbs                     pro     sup     int     cont      before manual      after manual    min   Ultrasound, settings/location:      min   Iontophoresis w/ dexamethasone, location:                                                 take home patch         in clinic   10 min   Ice       Heat    Position/location: sitting    min   Vasopneumatic Device, press/temp:     min   Other:     Skin assessment post-treatment (if applicable):      intact      redness- no adverse reaction     redness ??? adverse reaction:       min Patient Education:  YES   Reviewed HEP     Progressed/Changed HEP based on:        Other Objective/Functional Measures:    See DC     Post Treatment Pain Level (on 0 to 10) scale:   0  / 10     ASSESSMENT    Assessment/Changes in Function:     See DC       See Progress Note/Recertification    Patient will continue to benefit from  skilled PT services to n/a  to attain remaining goals   Progress toward goals / Updated goals:    See DC     PLAN      Upgrade activities as tolerated NO Continue plan of care     Discharge due to :      Other:      Therapist: Theodoro Gristave "BJ" Vern ClaudeAznar, DPT, Cert. MDT, Cert. DN    Date: 02/05/2014 Time: 1:37 PM   Future Appointments  Date Time Provider Department Center   02/09/2014 10:30 AM Quentin OreDave Renarda Mullinix, PT [facility]   02/12/2014 11:30 AM Quentin Oreave Jamisen Hawes, PT [facility]   02/13/2014 11:30 AM Estil Dafthong S Lee, MD [facility]ED   02/16/2014 10:30 AM Quentin Oreave Carthel Castille, PT [facility]   02/19/2014 11:30 AM Quentin Oreave Raquelle Pietro, PT [facility]

## 2014-02-09 ENCOUNTER — Encounter: Payer: PRIVATE HEALTH INSURANCE | Primary: Physician Assistant

## 2014-02-12 ENCOUNTER — Encounter: Payer: PRIVATE HEALTH INSURANCE | Primary: Physician Assistant

## 2014-02-13 ENCOUNTER — Ambulatory Visit
Admit: 2014-02-13 | Discharge: 2014-02-13 | Payer: PRIVATE HEALTH INSURANCE | Attending: Surgery | Primary: Physician Assistant

## 2014-02-13 ENCOUNTER — Encounter: Attending: Surgery | Primary: Physician Assistant

## 2014-02-13 DIAGNOSIS — K648 Other hemorrhoids: Secondary | ICD-10-CM | POA: Insufficient documentation

## 2014-02-13 DIAGNOSIS — K644 Residual hemorrhoidal skin tags: Secondary | ICD-10-CM | POA: Insufficient documentation

## 2014-02-13 MED ORDER — PEG 3350-ELECTROLYTES 240 G-22.72 G-6.72 G-5.84 G ORAL SOLUTION
ORAL | Status: DC
Start: 2014-02-13 — End: 2014-03-14

## 2014-02-13 NOTE — Progress Notes (Signed)
Patient presents for evaluation of hemorrhoids.  Patient states that they bleed intermittently and she does occasionally have hard stools.  Patient reports a flex sig in 2012.      Do you have an Advanced Directive? NO    Would you like information on Advanced Directives? NO

## 2014-02-13 NOTE — Patient Instructions (Signed)
If you have any questions or concerns about today's appointment, the verbal and/or written instructions you were given for follow up care, please call our office at [phone].    [facility]  [address], [address]  [place] [address]    [phone] office  [phone]

## 2014-02-13 NOTE — Progress Notes (Signed)
CALLING PT TO INFORM I NEED TO MOVE HER COLONOSCOPY TIME DOWN FROM 12PM TO 3PM (ARRIVAL TIME 2PM) ON 03/14/14.  THERE WAS NO ANSWER SO I LEFT A MSG TELLING HER TO GIVE ME A CALL BACK TO CONFIRM SHE RECEIVED THE MESSAGE.    [facility]UL[facility] WAS NOTIFIED VIA PHONE OF THE MOVE.

## 2014-02-13 NOTE — Progress Notes (Signed)
[facility]  Colon and Rectal Surgery  [address], [address]  [place], [place] [address]              Colon and Rectal Surgery          [name]  MRN: [id number]  Date: 02/13/2014     Age:  58 y.o.,      Sex: female    Date of Birth:  1955/10/10      Subjective    Darlene Espinoza is an 58 y.o. female referred by Dr. Fransisco Hertz.  She presents with symptoms of recurrent bright red bleeding per rectum secondary to hemorrhoid disease.  She denies any significant pain.  She is controlling her constipation well, but hard stools result in significant rectal bleeding.   Darlene Espinoza symptoms have presented for over 5 years.   She has not had previous rectal surgery.   Darlene Espinoza denies associated fever. A history of inflammatory bowel disease has not been reported.  Darlene Espinoza has been noted to have mild iron deficiency anemia presumably due Darlene bleeding hemorrhoid disease.    Darlene Espinoza denies any change in bowel habits, weight changes, nor any abdominal pain.  She also denies vomiting, diarrhea, mucousy stools, loss of appetite, reflux and nausea.  Bowel habits are reported as now normal without unsual diarrhea, constipation, or pain.      Colonoscopy was performed last about 10 years ago with unremarkable findings as per Darlene Espinoza.  She underwent a flexible sigmoidoscopy exam in [place], [place] for rectal bleeding, at which time Darlene hemorrhoid disease was found to be Darlene cause of Darlene bleeding.     Darlene family history is negative for colon cancer/polyps, other GI malignancies, nor inflammatory bowel diseases.         Past Medical History   Diagnosis Date   ??? Hemorrhoid    ??? Hypercholesterolemia        Past Surgical History   Procedure Laterality Date   ??? Hx gyn       hysterectomy   ??? Hx colonoscopy         No Known Allergies    Prior to Admission medications    Medication Sig Start Date End Date Taking? Authorizing Provider    simvastatin (ZOCOR) 20 mg tablet Take 10 mg by mouth nightly.   Yes Historical Provider   FISH OIL 500 mg cap Take  by mouth.   Yes Historical Provider   Cholecalciferol, Vitamin D3, (VITAMIN D3) 1,000 unit cap Take  by mouth.   Yes Historical Provider   cinnamon bark (CINNAMON) 500 mg cap Take  by mouth.   Yes Historical Provider   PEG 3350-Electrolytes (COLYTE) 240-22.72-6.72 -5.84 gram solution Take as directed 02/13/14  Yes Estil Daft, MD       Current Outpatient Prescriptions   Medication Sig Dispense Refill   ??? simvastatin (ZOCOR) 20 mg tablet Take 10 mg by mouth nightly.     ??? FISH OIL 500 mg cap Take  by mouth.     ??? Cholecalciferol, Vitamin D3, (VITAMIN D3) 1,000 unit cap Take  by mouth.     ??? cinnamon bark (CINNAMON) 500 mg cap Take  by mouth.     ??? PEG 3350-Electrolytes (COLYTE) 240-22.72-6.72 -5.84 gram solution Take as directed 4 L 0       History     Social History   ??? Marital Status: DIVORCED     Spouse Name: N/A     Number  of Children: N/A   ??? Years of Education: N/A     Occupational History   ??? Not on file.     Social History Main Topics   ??? Smoking status: Never Smoker    ??? Smokeless tobacco: Never Used   ??? Alcohol Use: No   ??? Drug Use: No   ??? Sexual Activity: Not on file     Other Topics Concern   ??? Not on file     Social History Narrative   ??? No narrative on file       Family History   Problem Relation Age of Onset   ??? Hypertension Mother    ??? Elevated Lipids Father    ??? Gout Father    ??? Diabetes Father            Review of Systems:    A comprehensive review of systems was negative except for that written in Darlene History of Present Illness.    Objective:      BP 119/76 mmHg   Pulse 78   Temp(Src) 98 ??F (36.7 ??C) (Oral)   Resp 18   Ht 5\' 3"  (1.6 m)   Wt 56.7 kg (125 lb)   BMI 22.15 kg/m2   SpO2 95%    Physical Exam:   GENERAL: alert, cooperative, no distress, appears stated age  LUNG: clear to auscultation bilaterally  HEART: regular rate and rhythm, S1, S2 normal, no murmur, click, rub or gallop   ABDOMEN: soft, non-tender. Bowel sounds normal. No masses,  no organomegaly  EXTREMITIES:  extremities normal, atraumatic, no cyanosis or edema     Anorectal:  With Darlene Espinoza in Darlene prone position Darlene anus appeared abnormal with findings of severe grade 4 hemorrhoid disease in Darlene right anterior quadrant.  Moderate to severe mixed hemorrhoid disease was noted in Darlene other quadrants.  Digital rectal examination revealed Normal sphincter tone and squeeze pressure.  Palpation revealed No Masses.    Anoscopy revealed Darlene hemorrhoid disease findings with Darlene presence of blood oozing from Darlene hemorrhoid disease in Darlene right anterior quadrant.      Assessment / Plan    Darlene Espinoza is an 58 y.o. female with severe chronic hemorrhoid disease with bleeding.    I recommended surgical hemorrhoidectomy for Darlene hemorrhoid disease.  She was in full agreement but needs to check her schedule first.    I also recommended a colonoscopy exam since she is due for a follow up screening exam as well as Darlene history of anemia.  She was in agreement with this.  I can plan on proceeding with a full colonoscopy first after Darlene appropriate bowel prep followed by Darlene surgical hemorrhoidectomy Darlene following day.    I discussed Darlene details of Darlene procedure as well as Darlene risks of surgery including bleeding, infection, pain, anesthesia complications, possibility of recurrent disease, and Darlene possibility of anal incontinence with any anal surgery.  Darlene Espinoza is willing to accept Darlene risks and would like to proceed with Darlene surgery.    Darlene risks of colonoscopy were also discussed including colon injury/perforation, anesthesia issues, bleeding, and Darlene possibility of incomplete examination.  She was willing to accept these risks and proceed with Darlene examination also.        Thank you for allowing me to participate in Darlene Espinoza's care.            Estil Dafthong S. Marte Celani, MD, FACS, FASCRS  Colon and Rectal Surgery  [facility]  Office [phone]  Fax     [phone]  02/13/2014  3:14 PM

## 2014-02-13 NOTE — H&P (View-Only) (Signed)
[facility]  Colon and Rectal Surgery  [address], [address]  [place] [address]              Colon and Rectal Surgery          Patient: Darlene Espinoza  MRN: [id number]  Date: 02/13/2014     Age:  58 y.o.,      Sex: female    Date of Birth:  06/25/1955      Subjective    [name] is an 58 y.o. female referred by Dr. Kirkland.  She presents with symptoms of recurrent bright red bleeding per rectum secondary to hemorrhoid disease.  She denies any significant pain.  She is controlling her constipation well, but hard stools result in significant rectal bleeding.   [name] reports symptoms have presented for over 5 years.   She has not had previous rectal surgery.   [name] denies associated fever. A history of inflammatory bowel disease has not been reported.  The patient has been noted to have mild iron deficiency anemia presumably due the bleeding hemorrhoid disease.    The patient denies any change in bowel habits, weight changes, nor any abdominal pain.  She also denies vomiting, diarrhea, mucousy stools, loss of appetite, reflux and nausea.  Bowel habits are reported as now normal without unsual diarrhea, constipation, or pain.      Colonoscopy was performed last about 10 years ago with unremarkable findings as per the patient.  She underwent a flexible sigmoidoscopy exam in [place], [place] for rectal bleeding, at which time the hemorrhoid disease was found to be the cause of the bleeding.     The family history is negative for colon cancer/polyps, other GI malignancies, nor inflammatory bowel diseases.         Past Medical History   Diagnosis Date   ??? Hemorrhoid    ??? Hypercholesterolemia        Past Surgical History   Procedure Laterality Date   ??? Hx gyn       hysterectomy   ??? Hx colonoscopy         No Known Allergies    Prior to Admission medications    Medication Sig Start Date End Date Taking? Authorizing Provider    simvastatin (ZOCOR) 20 mg tablet Take 10 mg by mouth nightly.   Yes Historical Provider   FISH OIL 500 mg cap Take  by mouth.   Yes Historical Provider   Cholecalciferol, Vitamin D3, (VITAMIN D3) 1,000 unit cap Take  by mouth.   Yes Historical Provider   cinnamon bark (CINNAMON) 500 mg cap Take  by mouth.   Yes Historical Provider   PEG 3350-Electrolytes (COLYTE) 240-22.72-6.72 -5.84 gram solution Take as directed 02/13/14  Yes Calene Paradiso S Ovella Manygoats, MD       Current Outpatient Prescriptions   Medication Sig Dispense Refill   ??? simvastatin (ZOCOR) 20 mg tablet Take 10 mg by mouth nightly.     ??? FISH OIL 500 mg cap Take  by mouth.     ??? Cholecalciferol, Vitamin D3, (VITAMIN D3) 1,000 unit cap Take  by mouth.     ??? cinnamon bark (CINNAMON) 500 mg cap Take  by mouth.     ??? PEG 3350-Electrolytes (COLYTE) 240-22.72-6.72 -5.84 gram solution Take as directed 4 L 0       History     Social History   ??? Marital Status: DIVORCED     Spouse Name: N/A     Number   of Children: N/A   ??? Years of Education: N/A     Occupational History   ??? Not on file.     Social History Main Topics   ??? Smoking status: Never Smoker    ??? Smokeless tobacco: Never Used   ??? Alcohol Use: No   ??? Drug Use: No   ??? Sexual Activity: Not on file     Other Topics Concern   ??? Not on file     Social History Narrative   ??? No narrative on file       Family History   Problem Relation Age of Onset   ??? Hypertension Mother    ??? Elevated Lipids Father    ??? Gout Father    ??? Diabetes Father            Review of Systems:    A comprehensive review of systems was negative except for that written in the History of Present Illness.    Objective:      BP 119/76 mmHg   Pulse 78   Temp(Src) 98 ??F (36.7 ??C) (Oral)   Resp 18   Ht 5' 3" (1.6 m)   Wt 56.7 kg (125 lb)   BMI 22.15 kg/m2   SpO2 95%    Physical Exam:   GENERAL: alert, cooperative, no distress, appears stated age  LUNG: clear to auscultation bilaterally  HEART: regular rate and rhythm, S1, S2 normal, no murmur, click, rub or gallop   ABDOMEN: soft, non-tender. Bowel sounds normal. No masses,  no organomegaly  EXTREMITIES:  extremities normal, atraumatic, no cyanosis or edema     Anorectal:  With the patient in the prone position the anus appeared abnormal with findings of severe grade 4 hemorrhoid disease in the right anterior quadrant.  Moderate to severe mixed hemorrhoid disease was noted in the other quadrants.  Digital rectal examination revealed Normal sphincter tone and squeeze pressure.  Palpation revealed No Masses.    Anoscopy revealed the hemorrhoid disease findings with the presence of blood oozing from the hemorrhoid disease in the right anterior quadrant.      Assessment / Plan    [name] is an 58 y.o. female with severe chronic hemorrhoid disease with bleeding.    I recommended surgical hemorrhoidectomy for the hemorrhoid disease.  She was in full agreement but needs to check her schedule first.    I also recommended a colonoscopy exam since she is due for a follow up screening exam as well as the history of anemia.  She was in agreement with this.  I can plan on proceeding with a full colonoscopy first after the appropriate bowel prep followed by the surgical hemorrhoidectomy the following day.    I discussed the details of the procedure as well as the risks of surgery including bleeding, infection, pain, anesthesia complications, possibility of recurrent disease, and the possibility of anal incontinence with any anal surgery.  The patient is willing to accept the risks and would like to proceed with the surgery.    The risks of colonoscopy were also discussed including colon injury/perforation, anesthesia issues, bleeding, and the possibility of incomplete examination.  She was willing to accept these risks and proceed with the examination also.        Thank you for allowing me to participate in the patient's care.            Shatima Zalar S. Kidada Ging, MD, FACS, FASCRS  Colon and Rectal Surgery  [facility]     Office [phone]  Fax     [phone]  02/13/2014  3:14 PM

## 2014-02-16 ENCOUNTER — Encounter: Payer: PRIVATE HEALTH INSURANCE | Primary: Physician Assistant

## 2014-02-19 ENCOUNTER — Encounter: Payer: PRIVATE HEALTH INSURANCE | Primary: Physician Assistant

## 2014-03-09 ENCOUNTER — Inpatient Hospital Stay: Payer: PRIVATE HEALTH INSURANCE | Primary: Physician Assistant

## 2014-03-12 NOTE — Telephone Encounter (Signed)
SPOKE TO INGEMAR BECAUSE PT ASKED ME TO SO HE COULD HELP HER UNDERSTAND THINGS BETTER. I ADVISED HIM THAT I NEEDED TO RESCHEDULE PT'S SURGERY FROM 03/15/14. HER COLONOSCOPY THAT'S SCHEDULED FOR 03/14/14 IS JUST FINE BECAUSE SHE NEEDS TO HAVE THAT DONE ANYWAY. BUT IM TRYING TO RESCHEDULE HER SURGERY POTENTIALLY TO Friday 03/16/14 EITHER AT 7:30AM OR AFTER 11AM. HOWEVER OUR OR SCHEDULING DEPARTMENT IS CLOSED RIGHT NOW FOR THE DAY SO I HAVE TO GIVE THEM A CALL TOMORROW TO SEE IF THEY STILL HAVE 60 MINS AVAILABLE TO ADD HER CASE ON. HE STATED HE UNDERSTOOD, AND I ASKED HIM IF HE WOULD BE THERE TOMORROW TO SPEAK WITH ME HE SAID THAT HE SHOULD BE.

## 2014-03-13 NOTE — Telephone Encounter (Signed)
I INFORMED HIM THAT HER SURGERY WAS RESCHEDULED FOR 03/16/14 AT 12:30PM (ARRIVAL TIME 10:30AM.) I TOLD HIM SHE COULD HAVE A REGULAR DIET ONCE SHE GOT HOME FROM HER COLONOSCOPY ON 03/14/14. BUT ON 03/15/14 AT 1PM WE WANT HER TO START CLEAR LIQUIDS AGAIN AND AND 7PM TO DRINK A 1/2 BOTTLE OF MAGNESIUM CITRATE AND HE CAN GET THAT OVER THE COUNTER AT ANY PHARMACY. HE SAID HE UNDERSTOOD.

## 2014-03-13 NOTE — Telephone Encounter (Signed)
PT SAID THAT I SPOKE TO HER SON YESTERDAY AND SHE WOULD LIKE ME TO SPEAK WITH HIM AGAIN TODAY BUT HE HAS GONE TO THE STORE RIGHT NOW. SO I INFORMED HER TO TELL HIM TO GIVE ME A CALL BACK AT [phone] AND ASK FOR JANET. SHE SAID SHE WOULD TELL HIM.    [facility] WAS NOTIFIED OF THE RESCHEDULE OF SURGERY FROM 03/15/14 AT 8:30AM TO 03/16/14 AT 12:30PM

## 2014-03-14 ENCOUNTER — Inpatient Hospital Stay: Payer: PRIVATE HEALTH INSURANCE

## 2014-03-14 MED ORDER — NALOXONE 0.4 MG/ML INJECTION
0.4 mg/mL | INTRAMUSCULAR | Status: DC | PRN
Start: 2014-03-14 — End: 2014-03-14

## 2014-03-14 MED ORDER — MIDAZOLAM 1 MG/ML IJ SOLN
1 mg/mL | INTRAMUSCULAR | Status: DC
Start: 2014-03-14 — End: 2014-03-14

## 2014-03-14 MED ORDER — SODIUM CHLORIDE 0.9 % IJ SYRG
Freq: Three times a day (TID) | INTRAMUSCULAR | Status: DC
Start: 2014-03-14 — End: 2014-03-14

## 2014-03-14 MED ORDER — EPINEPHRINE 0.1 MG/ML SYRINGE
0.1 mg/mL | Freq: Once | INTRAMUSCULAR | Status: DC | PRN
Start: 2014-03-14 — End: 2014-03-14

## 2014-03-14 MED ORDER — FLUMAZENIL 0.1 MG/ML IV SOLN
0.1 mg/mL | INTRAVENOUS | Status: DC | PRN
Start: 2014-03-14 — End: 2014-03-14

## 2014-03-14 MED ORDER — ONDANSETRON (PF) 4 MG/2 ML INJECTION
4 mg/2 mL | INTRAMUSCULAR | Status: AC
Start: 2014-03-14 — End: 2014-03-14
  Administered 2014-03-14: 21:00:00 via INTRAVENOUS

## 2014-03-14 MED ORDER — SODIUM CHLORIDE 0.9 % IV
INTRAVENOUS | Status: DC
Start: 2014-03-14 — End: 2014-03-14
  Administered 2014-03-14: 20:00:00 via INTRAVENOUS

## 2014-03-14 MED ORDER — SIMETHICONE 40 MG/0.6 ML ORAL DROPS, SUSP
40 mg/0.6 mL | ORAL | Status: DC | PRN
Start: 2014-03-14 — End: 2014-03-14

## 2014-03-14 MED ORDER — MEPERIDINE (PF) 100 MG/ML INJ SOLUTION
100 mg/ml | INTRAMUSCULAR | Status: DC | PRN
Start: 2014-03-14 — End: 2014-03-14
  Administered 2014-03-14 (×2): via INTRAVENOUS

## 2014-03-14 MED ORDER — SODIUM CHLORIDE 0.9 % IJ SYRG
INTRAMUSCULAR | Status: DC | PRN
Start: 2014-03-14 — End: 2014-03-14

## 2014-03-14 MED ORDER — MIDAZOLAM 1 MG/ML IJ SOLN
1 mg/mL | INTRAMUSCULAR | Status: DC | PRN
Start: 2014-03-14 — End: 2014-03-14
  Administered 2014-03-14 (×2): via INTRAVENOUS

## 2014-03-14 MED ORDER — ONDANSETRON (PF) 4 MG/2 ML INJECTION
4 mg/2 mL | Freq: Once | INTRAMUSCULAR | Status: AC
Start: 2014-03-14 — End: 2014-03-14

## 2014-03-14 MED ORDER — MEPERIDINE (PF) 100 MG/ML INJ SOLUTION
100 mg/ml | INTRAMUSCULAR | Status: DC
Start: 2014-03-14 — End: 2014-03-14

## 2014-03-14 MED ORDER — ATROPINE 0.1 MG/ML SYRINGE
0.1 mg/mL | Freq: Once | INTRAMUSCULAR | Status: DC | PRN
Start: 2014-03-14 — End: 2014-03-14

## 2014-03-14 MED FILL — ONDANSETRON (PF) 4 MG/2 ML INJECTION: 4 mg/2 mL | INTRAMUSCULAR | Qty: 2

## 2014-03-14 MED FILL — BD POSIFLUSH NORMAL SALINE 0.9 % INJECTION SYRINGE: INTRAMUSCULAR | Qty: 10

## 2014-03-14 MED FILL — DEMEROL (PF) 100 MG/ML INJECTION SYRINGE: 100 mg/mL | INTRAMUSCULAR | Qty: 2

## 2014-03-14 MED FILL — SIMETHICONE 40 MG/0.6 ML ORAL DROPS, SUSP: 40 mg/0.6 mL | ORAL | Qty: 1.2

## 2014-03-14 MED FILL — MIDAZOLAM 1 MG/ML IJ SOLN: 1 mg/mL | INTRAMUSCULAR | Qty: 10

## 2014-03-14 MED FILL — SODIUM CHLORIDE 0.9 % IV: INTRAVENOUS | Qty: 1000

## 2014-03-14 NOTE — Procedures (Signed)
Procedures by Derrill MemoLee, Chong  S at 03/14/14 1537                Author: Derrill MemoLee, Chong S  Service: Colon and Rectal Surgery  Author Type: Physician       Filed: 03/14/14 1544  Date of Service: 03/14/14 1537  Status: Signed          Editor: Estil DaftLee, Chong S            Pre-procedure Diagnoses        1. Encounter for screening colonoscopy for non-high-risk patient [Z12.11]                           Post-procedure Diagnoses        1. Internal and external prolapsed hemorrhoids [K64.8]        2. Encounter for screening colonoscopy for non-high-risk patient [Z12.11]                           Procedures        1. COLONOSCOPY,DIAGNOSTIC [[id number]][[id number]]                                                 Colonoscopy Procedure Note         Darlene Espinoza   Dec 26, 1955   [id number]                   Date of Procedure: 03/14/2014      Indications:    Hematochezia/melena, Screening  colonoscopy       Preoperative diagnosis: hemorrhoid disease, screening colonoscopy      Postoperative diagnosis: hemorrhoid disease, normal colonoscopy exam      Title of Procedure:  Colonoscopy, screening      Operator:  Estil Dafthong S Lee, MD      Assistant(s): Endoscopy Technician-1: Beacher MayMarie Brooks   Endoscopy RN-1: Carlyn ReichertMichelle Quinones, RN   Nurse Practitioner: [name], NP      Referring Source:  Christella NoaLEMENT S KIRKLAND JR, MD      Sedation:  Demerol 75 mg IV,  Versed 4 mg IV      ASA Class: ASA 2 - Mild systemic disease          Procedure Details:     Prior to the procedure, a history and physical were performed. The patients medications, allergies and sensitivities were reviewed and all questions were answered.  After informed  consent was obtained for the procedure, with all risks and benefits of procedure explained. The patient was taken to the endoscopy suite  and placed in the left lateral decubitus position.  Patient identification and proposed procedure were verified prior to the procedure by the nurse and I.      Following the  satisfactory administration  of  sedation,  the anus was inspected and showed severe grade 4 hemorrhoid disease in the right anterior quadrant and moderate to severe mixed hemorrhoid disease in the other quadrants.  Digital rectal  examination revealed Normal sphincter tone and squeeze pressure.  Palpation revealed No Masses.      Next the Olympus video colonoscope was introduced through the anus and advanced  to cecum, which was identified by the ileocecal valve and appendiceal orifice, terminal ileum.  The quality of preparation was  excellent.  The  terminal ileum was visualized.  The  colonoscope was then slowly withdrawn and the mucosa  carefully examined for any abnormalities.  Cecal withdrawl time was greater than six  minutes. The patient tolerated the procedure well.         Findings:    Rectum: severe symptomatic hemorrhoid disease   Sigmoid: normal   Descending Colon: normal   Transverse Colon: normal   Ascending Colon: normal   Cecum: normal   Terminal Ileum: normal      Other: Severe symptomatic hemorrhoid disease      Interventions:  none      Specimen Removed: * No specimens in log *      Complications: None.       EBL:  None.      Impression:   hemorrhoid disease, normal colonoscopy  exam      Recommendations: -Repeat colonoscopy in 10 years .  Proceed with surgical hemorrhoidectomy.       Discharge Disposition:  Home in the company of a driver when able to ambulate.            Estil Dafthong S. Lee, MD, FACS, FASCRS   Colon and Rectal Surgery   [facility]   Office [phone]   Fax     [phone]   03/14/2014  3:38 PM          [facility]   [address], [address]   [place], [place] [address]

## 2014-03-14 NOTE — Other (Signed)
Dr Lee at bedside speaking with family and patient.

## 2014-03-14 NOTE — Interval H&P Note (Signed)
H&P Update:  Darlene Espinoza was seen and examined.  History and physical has been reviewed. The patient has been examined. There have been no significant clinical changes since the completion of the originally dated History and Physical.    Signed By: Estil Dafthong S Tierria Watson, MD     March 14, 2014 3:04 PM

## 2014-03-14 NOTE — Procedures (Signed)
Colonoscopy Procedure Note      Darlene Espinoza  15-Nov-1955  [id number]                Date of Procedure: 03/14/2014    Indications:    Hematochezia/melena, Screening colonoscopy     Preoperative diagnosis: hemorrhoid disease, screening colonoscopy    Postoperative diagnosis: hemorrhoid disease, normal colonoscopy exam    Title of Procedure:  Colonoscopy, screening    Operator:  Darlene Dafthong S Wenceslao Loper, MD    Assistant(s): Endoscopy Technician-1: Darlene Espinoza  Endoscopy RN-1: Darlene ReichertMichelle Quinones, RN  Nurse Practitioner: Darlene Bleacherarol A Ascher, NP    Referring Source:  Darlene NoaLEMENT S KIRKLAND JR, MD    Sedation:  Demerol 75 mg IV,  Versed 4 mg IV    ASA Class: ASA 2 - Mild systemic disease       Procedure Details:    Prior to the procedure, a history and physical were performed. The patient???s medications, allergies and sensitivities were reviewed and all questions were answered.  After informed consent was obtained for the procedure, with all risks and benefits of procedure explained. The patient was taken to the endoscopy suite and placed in the left lateral decubitus position.  Patient identification and proposed procedure were verified prior to the procedure by the nurse and I.    Following the  satisfactory administration of sedation,  the anus was inspected and showed severe grade 4 hemorrhoid disease in the right anterior quadrant and moderate to severe mixed hemorrhoid disease in the other quadrants.  Digital rectal examination revealed Normal sphincter tone and squeeze pressure.  Palpation revealed No Masses.    Next the Olympus video colonoscope was introduced through the anus and advanced to cecum, which was identified by the ileocecal valve and appendiceal orifice, terminal ileum.  The quality of preparation was excellent.  The terminal ileum was visualized.  The colonoscope was then slowly withdrawn and the mucosa carefully examined for any abnormalities.   Cecal withdrawl time was greater than six minutes. The patient tolerated the procedure well.      Findings:   Rectum: severe symptomatic hemorrhoid disease  Sigmoid: normal  Descending Colon: normal  Transverse Colon: normal  Ascending Colon: normal  Cecum: normal  Terminal Ileum: normal    Other: Severe symptomatic hemorrhoid disease    Interventions:  none    Specimen Removed: * No specimens in log *    Complications: None.     EBL:  None.    Impression:   hemorrhoid disease, normal colonoscopy exam    Recommendations: -Repeat colonoscopy in 10 years.  Proceed with surgical hemorrhoidectomy.     Discharge Disposition:  Home in the company of a driver when able to ambulate.        Darlene Dafthong S. Darlene Bir, MD, FACS, FASCRS  Colon and Rectal Surgery  [facility]  Office [phone]  Fax     [phone]  03/14/2014  3:38 PM       [facility]  [address], [address]  [place], [place] [address]

## 2014-03-15 NOTE — Other (Signed)
PAT - SURGICAL PRE-ADMISSION INSTRUCTIONS    NAME:  Darlene Espinoza                                                          TODAY'S DATE:  03/15/2014    SURGERY DATE:  03/16/2014                                  SURGERY ARRIVAL TIME:   1030am    1. Do NOT eat or drink anything, including candy or gum, after MIDNIGHT on 03/15/2014 , unless you have specific instructions from your Surgeon or Anesthesia Provider to do so.  2. No smoking on the day of surgery.  3. No alcohol 24 hours prior to the day of surgery.  4. No recreational drugs for one week prior to the day of surgery.  5. Leave all valuables, including money/purse, at home.  6. Remove all jewelry, nail polish, makeup (including mascara); no lotions, powders, deodorant, or perfume/cologne/after shave.  7. Glasses/Contact lenses and Dentures may be worn to the hospital.  They will be removed prior to surgery.  8. Call your doctor if symptoms of a cold or illness develop within 24 ours prior to surgery.  9. AN ADULT MUST DRIVE YOU HOME AFTER OUTPATIENT SURGERY.   10. If you are having an OUTPATIENT procedure, please make arrangements for a responsible adult to be with you for 24 hours after your surgery.  11. If you are admitted to the hospital, you will be assigned to a bed after surgery is complete.  Normally a family member will not be able to see you until you are in your assigned bed.  12. Family is encouraged to accompany you to the hospital.  We ask visitors in the treatment area to be limited to ONE person at a time to ensure patient privacy.  EXCEPTIONS WILL BE MADE AS NEEDED.  13. Children under 12 are discouraged from entering the treatment area and need to be supervised by an adult when in the waiting room.    Special Instructions:    Bring list of CURRENT medications .    Patient Prep:    shower with anti-bacterial soap    These surgical instructions were reviewed with patient during the PAT phone call (visit/phone call).       Directions:  On the morning of surgery, please go to the [facility].  Enter the building from the [address]ing [address] entrance, 1st floor (next to the Emergency Room entrance).  Take the elevator to the 2nd floor.  Sign in at the Registration Desk.    If you have any questions and/or concerns, please do not hesitate to call:  (Prior to the day of surgery)  [facility] unit:  [phone]  (Day of surgery)  [facility]:  [phone]

## 2014-03-16 ENCOUNTER — Inpatient Hospital Stay: Payer: PRIVATE HEALTH INSURANCE

## 2014-03-16 MED ORDER — FAMOTIDINE 20 MG TAB
20 mg | Freq: Once | ORAL | Status: AC
Start: 2014-03-16 — End: 2014-03-16
  Administered 2014-03-16: 16:00:00 via ORAL

## 2014-03-16 MED ORDER — BUPIVACAINE (PF) 0.25 % (2.5 MG/ML) IJ SOLN
0.25 % (2.5 mg/mL) | INTRAMUSCULAR | Status: AC
Start: 2014-03-16 — End: ?

## 2014-03-16 MED ORDER — LIDOCAINE (PF) 20 MG/ML (2 %) IJ SOLN
20 mg/mL (2 %) | INTRAMUSCULAR | Status: DC | PRN
Start: 2014-03-16 — End: 2014-03-16
  Administered 2014-03-16: 18:00:00 via INTRAVENOUS

## 2014-03-16 MED ORDER — FENTANYL CITRATE (PF) 50 MCG/ML IJ SOLN
50 mcg/mL | INTRAMUSCULAR | Status: AC
Start: 2014-03-16 — End: ?

## 2014-03-16 MED ORDER — BUPIVACAINE (PF) 0.25 % (2.5 MG/ML) IJ SOLN
0.25 % (2.5 mg/mL) | INTRAMUSCULAR | Status: DC | PRN
Start: 2014-03-16 — End: 2014-03-16
  Administered 2014-03-16: 19:00:00

## 2014-03-16 MED ORDER — KETOROLAC TROMETHAMINE 30 MG/ML INJECTION
30 mg/mL (1 mL) | INTRAMUSCULAR | Status: DC
Start: 2014-03-16 — End: 2014-03-16
  Administered 2014-03-16: 20:00:00

## 2014-03-16 MED ORDER — LACTATED RINGERS IV
INTRAVENOUS | Status: DC
Start: 2014-03-16 — End: 2014-03-16
  Administered 2014-03-16 (×2): via INTRAVENOUS

## 2014-03-16 MED ORDER — DEXAMETHASONE SODIUM PHOSPHATE 4 MG/ML IJ SOLN
4 mg/mL | INTRAMUSCULAR | Status: DC | PRN
Start: 2014-03-16 — End: 2014-03-16
  Administered 2014-03-16: 18:00:00 via INTRAVENOUS

## 2014-03-16 MED ORDER — MIDAZOLAM 1 MG/ML IJ SOLN
1 mg/mL | INTRAMUSCULAR | Status: AC
Start: 2014-03-16 — End: ?

## 2014-03-16 MED ORDER — LACTATED RINGERS IV
INTRAVENOUS | Status: DC
Start: 2014-03-16 — End: 2014-03-16

## 2014-03-16 MED ORDER — SCOPOLAMINE (1.3-1.5) MG 72 HR TRANSDERM PATCH
1 mg over 3 days | TRANSDERMAL | Status: DC
Start: 2014-03-16 — End: 2014-03-16

## 2014-03-16 MED ORDER — PROPOFOL 10 MG/ML IV EMUL
10 mg/mL | INTRAVENOUS | Status: DC | PRN
Start: 2014-03-16 — End: 2014-03-16
  Administered 2014-03-16 (×10): via INTRAVENOUS

## 2014-03-16 MED ORDER — MIDAZOLAM 1 MG/ML IJ SOLN
1 mg/mL | INTRAMUSCULAR | Status: DC | PRN
Start: 2014-03-16 — End: 2014-03-16
  Administered 2014-03-16: 18:00:00 via INTRAVENOUS

## 2014-03-16 MED ORDER — ALBUTEROL SULFATE 0.083 % (0.83 MG/ML) SOLN FOR INHALATION
2.5 mg /3 mL (0.083 %) | RESPIRATORY_TRACT | Status: DC | PRN
Start: 2014-03-16 — End: 2014-03-16

## 2014-03-16 MED ORDER — SCOPOLAMINE (1.3-1.5) MG 72 HR TRANSDERM PATCH
1 mg over 3 days | TRANSDERMAL | Status: AC
Start: 2014-03-16 — End: 2014-03-16

## 2014-03-16 MED ORDER — LIDOCAINE-EPINEPHRINE 1 %-1:100,000 IJ SOLN
1 %-:00,000 | INTRAMUSCULAR | Status: DC | PRN
Start: 2014-03-16 — End: 2014-03-16
  Administered 2014-03-16: 18:00:00 via INTRADERMAL

## 2014-03-16 MED ORDER — HYDROCODONE-ACETAMINOPHEN 5 MG-325 MG TAB
5-325 mg | ORAL_TABLET | ORAL | Status: DC | PRN
Start: 2014-03-16 — End: 2015-05-08

## 2014-03-16 MED ORDER — KETOROLAC TROMETHAMINE 30 MG/ML INJECTION
30 mg/mL (1 mL) | INTRAMUSCULAR | Status: AC
Start: 2014-03-16 — End: 2014-03-16
  Administered 2014-03-16: 20:00:00 via INTRAVENOUS

## 2014-03-16 MED ORDER — HYDROCODONE-ACETAMINOPHEN 5 MG-325 MG TAB
5-325 mg | ORAL | Status: DC | PRN
Start: 2014-03-16 — End: 2014-03-16
  Administered 2014-03-16: 19:00:00 via ORAL

## 2014-03-16 MED ORDER — FENTANYL CITRATE (PF) 50 MCG/ML IJ SOLN
50 mcg/mL | INTRAMUSCULAR | Status: DC | PRN
Start: 2014-03-16 — End: 2014-03-16
  Administered 2014-03-16 (×2): via INTRAVENOUS

## 2014-03-16 MED ORDER — ONDANSETRON (PF) 4 MG/2 ML INJECTION
4 mg/2 mL | INTRAMUSCULAR | Status: DC | PRN
Start: 2014-03-16 — End: 2014-03-16
  Administered 2014-03-16: 18:00:00 via INTRAVENOUS

## 2014-03-16 MED ORDER — BACITRACIN 500 UNIT/G OINTMENT
500 unit/gram | CUTANEOUS | Status: AC
Start: 2014-03-16 — End: ?

## 2014-03-16 MED ORDER — LIDOCAINE-EPINEPHRINE 1 %-1:100,000 IJ SOLN
1 %-:00,000 | INTRAMUSCULAR | Status: AC
Start: 2014-03-16 — End: ?

## 2014-03-16 MED FILL — HYDROCODONE-ACETAMINOPHEN 5 MG-325 MG TAB: 5-325 mg | ORAL | Qty: 1

## 2014-03-16 MED FILL — LIDOCAINE-EPINEPHRINE 1 %-1:100,000 IJ SOLN: 1 %-:00,000 | INTRAMUSCULAR | Qty: 20

## 2014-03-16 MED FILL — DEXAMETHASONE SODIUM PHOSPHATE 4 MG/ML IJ SOLN: 4 mg/mL | INTRAMUSCULAR | Qty: 8

## 2014-03-16 MED FILL — LACTATED RINGERS IV: INTRAVENOUS | Qty: 1000

## 2014-03-16 MED FILL — FAMOTIDINE 20 MG TAB: 20 mg | ORAL | Qty: 1

## 2014-03-16 MED FILL — FENTANYL (PF) 100 MCG/2 ML (50 MCG/ML) INTRAVENOUS SYRINGE: 100 mcg/2 mL (50 mcg/mL) | INTRAVENOUS | Qty: 2

## 2014-03-16 MED FILL — DIPRIVAN 10 MG/ML INTRAVENOUS EMULSION: 10 mg/mL | INTRAVENOUS | Qty: 220

## 2014-03-16 MED FILL — BUPIVACAINE (PF) 0.25 % (2.5 MG/ML) IJ SOLN: 0.25 % (2.5 mg/mL) | INTRAMUSCULAR | Qty: 30

## 2014-03-16 MED FILL — LIDOCAINE (PF) 20 MG/ML (2 %) IJ SOLN: 20 mg/mL (2 %) | INTRAMUSCULAR | Qty: 60

## 2014-03-16 MED FILL — MIDAZOLAM 1 MG/ML IJ SOLN: 1 mg/mL | INTRAMUSCULAR | Qty: 2

## 2014-03-16 MED FILL — KETOROLAC TROMETHAMINE 30 MG/ML INJECTION: 30 mg/mL (1 mL) | INTRAMUSCULAR | Qty: 1

## 2014-03-16 MED FILL — BACITRACIN 500 UNIT/G OINTMENT: 500 unit/gram | CUTANEOUS | Qty: 15

## 2014-03-16 MED FILL — ONDANSETRON (PF) 4 MG/2 ML INJECTION: 4 mg/2 mL | INTRAMUSCULAR | Qty: 4

## 2014-03-16 MED FILL — SCOPOLAMINE (1.3-1.5) MG 72 HR TRANSDERM PATCH: 1 mg over 3 days | TRANSDERMAL | Qty: 1

## 2014-03-16 NOTE — Op Note (Signed)
 COLON AND RECTAL SURGERY OPERATIVE NOTE            Procedure Date: 03/16/2014    Indications: Symptomatic hemorrhoid disease with bleeding    Pre-operative Diagnosis:  Symptomatic hemorrhoid disease with bleeding    Post-operative Diagnosis:  Symptomatic hemorrhoid disease with bleeding    Title of Procedure:  Complex 3 Quadrant External and Internal Hemorrhoidectomy     Surgeon(s): Isaac Manus, MD    Assistants: Circ-1: Derrick Fling, RN  Scrub Tech-1: Lynelle Sara  Surg Asst-1: Jackelyn Marvel    Anesthesiologist: Anesthesiologist: Merlyn Starring, MD  CRNA: Bernadine Briar, CRNA    Anesthesia: MAC    ASA Class: ASA 2 - Mild systemic disease      Indication for surgery:   The patient is a 58 y.o., FIL[place] female who was recently seen in clinic and was noted to have symptomatic hemorrhoid disease with bleeding. Due to the severity of the disease process, surgical hemorrhoidectomy was discussed for more definitive management. The patient was informed of the indication for the procedure as well as the risk and complications.  I discussed the details of the procedure as well as the risks of surgery including bleeding, infection, pain, anesthesia complications, possibility of recurrent disease, and the possibility of anal incontinence with any anal surgery. The patient demonstrated good understanding of surgical considerations, risks, benefits and alternatives and was willing to accept the risks of surgery. The patient elected to proceed with surgery, and therefore the surgery was scheduled.        Procedure    Findings:  Right anterior quadrant - severe grade 4 hemorrhoid disease                    Right posterior quadrant - moderate mixed hemorrhoid disease                    Left lateral quadrant - moderate mixed hemorrhoid disease     Procedure Details:   The patient was brought to the operating room and placed on the operating room table in the prone position after MAC anesthesia was successfully  achieved by the Anesthesiology serrvice. The safety strap was carefully secured and pressure points were carefully padded. The patient was then prepped and draped in the standard sterile fashion. The pneumatic compression boots in the lower extremity were placed prior to surgery. I next injected 20 mL of 1% lidocaine with epinephrine at the operative sites for satisfactory local anesthesia.     With the aid of the Pratt retractor, the anal canal was thoroughly evaluated. The patient had the symptomatic hemorrhoid disease as listed in the Findings section above. No other abnormalities were noted.     I proceeded with the surgical hemorrhoidectomy in the following fashion for the right anterior quadrant hemorrhoid disease. An inverted "V" incision was made sharply encompassing the diseased external hemorrhoidal tissues. This was excised sharply and with the aid of the electrocautery for hemostasis from the subcutaneous external sphincter muscle layer.  In continuity, all of the internal hemorrhoidal tissues were excised from the internal sphincter muscle layer with the use of the Harmonic Focus. Care was taken to avoid any injury to the sphincter muscle layers. The mucosal defect was then closed with a 3-0 Chromic suture in the running locked fashion. The anoderm and the perianal skin defects were closed with the same suture, but in a simple running fashion. Next the hemorrhoid disease in the left lateral  quadrant and right posterior quadrant were surgically excised in similar fashion. The anal canal was then irrigated with sterile saline, and hemostasis was noted to be complete. I next injected approximately 10 mL of 0.25% marcaine at the operative sites for post operative analgesia. A sterile dry dressing was next placed at the anal verge.     The patient tolerated the procedure well and sent to the recovery room in good condition. Needle and sponge counts were correct.       Estimated Blood Loss:  5 mL            Drains: None           Total IV Fluids: 400 mL           Specimens: Sent - right anterior, left lateral, and right posterior hemorrhoids                  Complications: None             Disposition: aroused from sedation, and taken to the recovery room in a stable condition           Condition: good    Surgeon's Signature:       Isaac Manus, MD, FACS, FASCRS  Colon and Rectal Surgery  [facility]  Office [phone]  Fax     [phone]  03/16/2014  1:45 PM

## 2014-03-16 NOTE — Progress Notes (Signed)
Discharge instructions given to patient and son including the sitz bath. Both verbalized understanding of the instructions

## 2014-03-16 NOTE — Progress Notes (Signed)
Dr. Nedra HaiLee came to talk to patient and son and [facility]

## 2014-03-16 NOTE — Op Note (Signed)
COLON AND RECTAL SURGERY OPERATIVE NOTE            Procedure Date: 03/16/2014    Indications: Symptomatic hemorrhoid disease with bleeding    Pre-operative Diagnosis:  Symptomatic hemorrhoid disease with bleeding    Post-operative Diagnosis:  Symptomatic hemorrhoid disease with bleeding    Title of Procedure:  Complex 3 Quadrant External and Internal Hemorrhoidectomy     Surgeon(s): Estil Dafthong S Kadra Kohan, MD    Assistants: Circ-1: Dixon BoosAllyson R Speirs, RN  Scrub Tech-1: Dolores LoryNicholas R Barone  Surg Asst-1: Waldon ReiningAnthony E Smith    Anesthesiologist: Anesthesiologist: Abelina Bacheloravid L Kay, MD  CRNA: Alvino BloodBeyene L Yosief, CRNA    Anesthesia: MAC    ASA Class: ASA 2 - Mild systemic disease      Indication for surgery:   The patient is a 58 y.o., FILIP[place] female who was recently seen in clinic and was noted to have symptomatic hemorrhoid disease with bleeding. Due to the severity of the disease process, surgical hemorrhoidectomy was discussed for more definitive management. The patient was informed of the indication for the procedure as well as the risk and complications.  I discussed the details of the procedure as well as the risks of surgery including bleeding, infection, pain, anesthesia complications, possibility of recurrent disease, and the possibility of anal incontinence with any anal surgery. The patient demonstrated good understanding of surgical considerations, risks, benefits and alternatives and was willing to accept the risks of surgery. The patient elected to proceed with surgery, and therefore the surgery was scheduled.        Procedure    Findings:  Right anterior quadrant - severe grade 4 hemorrhoid disease                    Right posterior quadrant - moderate mixed hemorrhoid disease                    Left lateral quadrant - moderate mixed hemorrhoid disease     Procedure Details:   The patient was brought to the operating room and placed on the operating room table in the prone position after MAC  anesthesia was successfully achieved by the Anesthesiology serrvice. The safety strap was carefully secured and pressure points were carefully padded. The patient was then prepped and draped in the standard sterile fashion. The pneumatic compression boots in the lower extremity were placed prior to surgery. I next injected 20 mL of 1% lidocaine with epinephrine at the operative sites for satisfactory local anesthesia.     With the aid of the Pratt retractor, the anal canal was thoroughly evaluated. The patient had the symptomatic hemorrhoid disease as listed in the Findings section above. No other abnormalities were noted.     I proceeded with the surgical hemorrhoidectomy in the following fashion for the right anterior quadrant hemorrhoid disease. An inverted "V" incision was made sharply encompassing the diseased external hemorrhoidal tissues. This was excised sharply and with the aid of the electrocautery for hemostasis from the subcutaneous external sphincter muscle layer.  In continuity, all of the internal hemorrhoidal tissues were excised from the internal sphincter muscle layer with the use of the Harmonic Focus. Care was taken to avoid any injury to the sphincter muscle layers. The mucosal defect was then closed with a 3-0 Chromic suture in the running locked fashion. The anoderm and the perianal skin defects were closed with the same suture, but in a simple running fashion. Next the hemorrhoid disease in the left lateral  quadrant and right posterior quadrant were surgically excised in similar fashion. The anal canal was then irrigated with sterile saline, and hemostasis was noted to be complete. I next injected approximately 10 mL of 0.25% marcaine at the operative sites for post operative analgesia. A sterile dry dressing was next placed at the anal verge.     The patient tolerated the procedure well and sent to the recovery room in good condition. Needle and sponge counts were correct.        Estimated Blood Loss:  5 mL           Drains: None           Total IV Fluids: 400 mL           Specimens: Sent - right anterior, left lateral, and right posterior hemorrhoids                  Complications: None             Disposition: aroused from sedation, and taken to the recovery room in a stable condition           Condition: good    Surgeon???s Signature:       Estil Dafthong S. Wes Lezotte, MD, FACS, FASCRS  Colon and Rectal Surgery  [facility]  Office [phone]  Fax     [phone]  03/16/2014  1:45 PM

## 2014-03-16 NOTE — H&P (Signed)
[facility]  [address], [address]  [place], [place] [id number]            Colon and Rectal Surgery History and Physical    Patient: Darlene Espinoza  MRN: [id number]  SSN: [id number]   Date of Birth: 1955/06/08  Age: 58 y.o.  Sex: female       Ms.Darlene Espinoza is an 58 y.o. female with severe chronic hemorrhoid disease with bleeding.  She is here for the surgical hemorrhoidectomy.    The colonoscopy on 03/14/2014 was otherwise normal.      Past Medical History   Diagnosis Date   ??? Hemorrhoid    ??? Hypercholesterolemia      Past Surgical History   Procedure Laterality Date   ??? Hx gyn       hysterectomy   ??? Hx colonoscopy       Allergies   Allergen Reactions   ??? Alcohol Rash     Current Facility-Administered Medications   Medication Dose Route Frequency Provider Last Rate Last Dose   ??? lactated ringers infusion  25 mL/hr IntraVENous CONTINUOUS Allyson L Day, CRNA 25 mL/hr at 03/16/14 1113 25 mL/hr at 03/16/14 1113   ??? scopolamine (TRANSDERM-SCOP) 1.5 mg  1.5 mg TransDERmal Q72H Kirkland HunVoltaire S Misa, MD             Physical Examination    Filed Vitals:    03/15/14 1254 03/16/14 1045 03/16/14 1103   BP:   126/78   Pulse:   75   Temp:   97.6 ??F (36.4 ??C)   Resp:   18   Height: 5\' 2"  (1.575 m) 5\' 2"  (1.575 m)    Weight: 57.153 kg (126 lb) 54.885 kg (121 lb)    SpO2:   98%        Physical Exam:   GENERAL: alert, cooperative, no distress, appears stated age  LUNG: clear to auscultation bilaterally  HEART: regular rate and rhythm, S1, S2 normal, no murmur, click, rub or gallop  ABDOMEN: soft, non-tender. Bowel sounds normal. No masses,  no organomegaly      Assessment and Plan:    I recommended surgical hemorrhoidectomy for the hemorrhoid disease. She was in full agreement but needs to check her schedule first.    I discussed the details of the procedure as well as the risks of surgery including bleeding, infection, pain, anesthesia complications, possibility  of recurrent disease, and the possibility of anal incontinence with any anal surgery. The patient is willing to accept the risks and would like to proceed with the surgery.         Estil Dafthong S. Jonathan Corpus, MD, FACS, FASCRS  Colon and Rectal Surgery  [facility]  Office [phone]  Fax     [phone]  03/16/2014  12:44 PM     [facility]

## 2014-03-16 NOTE — Other (Signed)
Darlene Espinoza- ok to speak to about procedure

## 2014-03-16 NOTE — Anesthesia Pre-Procedure Evaluation (Signed)
Anesthetic History   No history of anesthetic complications            Review of Systems / Medical History  Patient summary reviewed, nursing notes reviewed and pertinent labs reviewed    Pulmonary  Within defined limits                 Neuro/Psych   Within defined limits           Cardiovascular                  Exercise tolerance: >4 METS  Comments: High cholesterol   GI/Hepatic/Renal  Within defined limits              Endo/Other  Within defined limits           Other Findings              Physical Exam    Airway  Mallampati: I  TM Distance: 4 - 6 cm  Neck ROM: normal range of motion   Mouth opening: Normal     Cardiovascular    Rhythm: regular  Rate: normal         Dental  No notable dental hx       Pulmonary  Breath sounds clear to auscultation               Abdominal  GI exam deferred       Other Findings            Anesthetic Plan    ASA: 2  Anesthesia type: MAC          Induction: Intravenous  Anesthetic plan and risks discussed with: Patient

## 2014-03-16 NOTE — Anesthesia Post-Procedure Evaluation (Signed)
Post-Anesthesia Evaluation & Assessment    Visit Vitals   Item Reading   ??? BP 126/78 mmHg   ??? Pulse 75   ??? Temp 36.4 ??C (97.6 ??F)   ??? Resp 18   ??? Ht '5\' 2"'  (1.575 m)   ??? Wt 54.885 kg (121 lb)   ??? BMI 22.13 kg/m2   ??? SpO2 98%       Nausea/Vomiting: no nausea and no vomiting    Post-operative hydration adequate.    Pain score (VAS): 0    Mental status & Level of consciousness: alert and oriented x 3    Neurological status: moves all extremities, sensation grossly intact    Pulmonary status: airway patent, no supplemental oxygen required    Complications related to anesthesia: none    Patient has met all discharge requirements.    Additional comments:        Wynn Banker, CRNA  December 18, 2015Post-Anesthesia Evaluation and Assessment    Patient: Darlene Espinoza MRN: [id number]  SSN: [id number]    Date of Birth: March 15, 1956  Age: 58 y.o.  Sex: female       Cardiovascular Function/Vital Signs  Visit Vitals   Item Reading   ??? BP 126/78 mmHg   ??? Pulse 75   ??? Temp 36.4 ??C (97.6 ??F)   ??? Resp 18   ??? Ht '5\' 2"'  (1.575 m)   ??? Wt 54.885 kg (121 lb)   ??? BMI 22.13 kg/m2   ??? SpO2 98%       Patient is status post MAC anesthesia for Procedure(s):  HEMORRHOIDECTOMY EXCISION.    Nausea/Vomiting: None    Postoperative hydration reviewed and adequate.    Pain:  Pain Scale 1: Numeric (0 - 10) (03/16/14 1045)  Pain Intensity 1: 0 (03/16/14 1045)   Managed    Neurological Status:   Neuro (WDL): Within Defined Limits (03/16/14 1053)   At baseline    Mental Status and Level of Consciousness: Alert and oriented     Pulmonary Status:   O2 Device: Room air (03/16/14 1336)   Adequate oxygenation and airway patent    Complications related to anesthesia: None    Post-anesthesia assessment completed. No concerns    Signed By: Wynn Banker, CRNA     March 16, 2014

## 2014-03-26 ENCOUNTER — Ambulatory Visit
Admit: 2014-03-26 | Discharge: 2014-03-26 | Payer: PRIVATE HEALTH INSURANCE | Attending: Nurse Practitioner | Primary: Physician Assistant

## 2014-03-26 DIAGNOSIS — K644 Residual hemorrhoidal skin tags: Secondary | ICD-10-CM

## 2014-03-26 NOTE — Progress Notes (Signed)
Patient presents today for a post op visit. Patient had a hemorrhoidectomy on 03/16/14/ Patient still complains of pain at the surgical site.     1. Have you been to the ER, urgent care clinic or hospitalized since your last visit? no     2. Have you seen or consulted any other health care providers outside of the [facility] since your last visit (Include any pap smears or colon screening)? no     Do you have an Advanced Directive? yes     Would you like information on Advanced Directives? no

## 2014-03-26 NOTE — Progress Notes (Signed)
[facility]  [address]  [address]  [place] [id number]  [phone]    Post Operative Note    Patient: Darlene Espinoza  Admitted: (Not on file) MRN: [id number]     Age:  58 y.o.,      Sex: female    Date of Birth:  Jun 18, 1955     Subjective:      [name] is a 58 y.o. female presents for postop care 2  weeks following  Complex 3 Quadrant External and Internal Hemorrhoidectomy.   She is doing well post op and is only taking 1 Percocet/day.  She is not taking sitz baths 3 x/day as prescribed.  Taking stool softeners and is afraid to eat much because she is worried about having a BM.    Objective:     Filed Vitals:    03/26/14 1027   BP: 117/66   Pulse: 68   Temp: 97.3 ??F (36.3 ??C)   Resp: 16   Height: 5\' 3"  (1.6 m)   Weight: 54.432 kg (120 lb)   SpO2: 100%   PainSc:   5   PainLoc: Rectum       Physical Exam:  General: alert, cooperative, no distress, appears stated age  Abdomen: soft, non-tender, bowels sounds active  Incision:  healing well, no drainage, no erythema, no swelling, no dehiscence, incision well approximated        Assessment:     Ms. Darlene Espinoza is Doing well postoperatively.    Plan:     1. Sitz baths 3x/week  2. Pain medication as needed  3. F/U in 2 weeks    Lytle Buttearol Ilijah Doucet, NP  March 26, 2014  10:39 AM

## 2014-03-28 ENCOUNTER — Encounter: Attending: Surgery | Primary: Physician Assistant

## 2014-04-09 ENCOUNTER — Encounter: Attending: Nurse Practitioner | Primary: Physician Assistant

## 2014-04-09 ENCOUNTER — Ambulatory Visit
Admit: 2014-04-09 | Discharge: 2014-04-09 | Payer: PRIVATE HEALTH INSURANCE | Attending: Nurse Practitioner | Primary: Physician Assistant

## 2014-04-09 DIAGNOSIS — K644 Residual hemorrhoidal skin tags: Secondary | ICD-10-CM

## 2014-04-09 NOTE — Progress Notes (Signed)
[facility]  [address]  [address]  [place] [id number]  [phone]    Post Operative Note    Patient: Darlene Espinoza  Admitted: (Not on file) MRN: [id number]     Age:  59 y.o.,      Sex: female    Date of Birth:  04-25-1955     Subjective:      Darlene Espinoza is a 59 y.o. female presents for postop care 4 weeks following Complex 3 Quadrant External and Internal Hemorrhoidectomy.   She is doing well post op and is not taking any pain medication at this time.. She is taking sitz baths as needed.. Taking stool softeners and her bowels work well when she takes them.     Objective:     Filed Vitals:    04/09/14 0937   BP: 127/74   Pulse: 67   Temp: 98 ??F (36.7 ??C)   TempSrc: Oral   Resp: 18   Height: 5\' 3"  (1.6 m)   Weight: 55.792 kg (123 lb)   SpO2: 98%   PainSc:   0 - No pain       Physical Exam:  General: alert, cooperative, no distress, appears stated age  Abdomen: soft, non-tender, bowels sounds active  Incision:  healing well, no drainage, no erythema, no swelling, no dehiscence, incision well approximated        Assessment:     Ms. Darlene Espinoza is Doing well postoperatively.    Plan:     1. Continue stool softeners daily  2. F/U PRN      Lytle Buttearol Zahirah Cheslock, NP  April 09, 2014  10:05 AM

## 2014-04-09 NOTE — Patient Instructions (Signed)
If you have any questions or concerns about today's appointment, the verbal and/or written instructions you were given for follow up care, please call our office at [phone].    [facility]  [address], [address]  [place] [address]    [phone] office  [phone]

## 2014-04-09 NOTE — Progress Notes (Signed)
1. Have you been to the ER, urgent care clinic or hospitalized since your last visit? NO.     2. Have you seen or consulted any other health care providers outside of the [facility] since your last visit (Include any pap smears or colon screening)? NO      Do you have an Advanced Directive? NO    Would you like information on Advanced Directives? NO    Patient presents for post op visit.

## 2015-02-14 ENCOUNTER — Encounter

## 2015-02-25 ENCOUNTER — Inpatient Hospital Stay: Admit: 2015-02-25 | Payer: PRIVATE HEALTH INSURANCE | Attending: Family Medicine | Primary: Physician Assistant

## 2015-02-25 DIAGNOSIS — R319 Hematuria, unspecified: Secondary | ICD-10-CM

## 2015-02-25 MED ORDER — IOPAMIDOL 61 % IV SOLN
300 mg iodine /mL (61 %) | Freq: Once | INTRAVENOUS | Status: AC
Start: 2015-02-25 — End: 2015-02-25
  Administered 2015-02-25: 18:00:00 via INTRAVENOUS

## 2015-02-25 MED FILL — ISOVUE-300  61 % INTRAVENOUS SOLUTION: 300 mg iodine /mL (61 %) | INTRAVENOUS | Qty: 100

## 2015-03-05 ENCOUNTER — Encounter: Attending: Urology | Primary: Physician Assistant

## 2015-03-12 ENCOUNTER — Ambulatory Visit
Admit: 2015-03-12 | Discharge: 2015-03-12 | Payer: PRIVATE HEALTH INSURANCE | Attending: Urology | Primary: Physician Assistant

## 2015-03-12 DIAGNOSIS — R319 Hematuria, unspecified: Secondary | ICD-10-CM

## 2015-03-12 LAB — AMB POC URINALYSIS DIP STICK AUTO W/O MICRO
Bilirubin (UA POC): NEGATIVE
Glucose (UA POC): NEGATIVE
Ketones (UA POC): NEGATIVE
Leukocyte esterase (UA POC): NEGATIVE
Nitrites (UA POC): NEGATIVE
Protein (UA POC): NEGATIVE mg/dL
Specific gravity (UA POC): 1.025 (ref 1.001–1.035)
Urobilinogen (UA POC): 0.2 (ref 0.2–1)
pH (UA POC): 5.5 (ref 4.6–8.0)

## 2015-03-12 NOTE — Progress Notes (Signed)
Chief Complaint   Patient presents with   ??? Kidney Stone       Darlene Espinoza is a 59 y.o. female who was referred by Dr. Fransisco Hertz for consultation and evaluation of hematuria and kidney stone.    she reports upper back pain that began roughly 3 weeks ago. Tenderness to palpation.    Denies fever / chills / nausea / vomiting/ gross hematuria.  Pain only occurred twice. She has not had Pain for the past 2 weeks.    Reports: 4-5x per night nocturia  Does have urinary frequency  Does not have urinary urgency  Does not have urinary hesitancy  Does not have dysuria  Does not  have the sense of PVR  Good FOS  History of microhematuria    KUB 03/12/15:  Impression:  2.2cm RUQ stone c/w known Gallstone  No renal stones seen on today's study    CT AP 11/28/216  Impression:  1. Nonobstructive bilateral nephrolithiasis with the largest stone in the right  kidney.  2. Benign right renal cyst. Kidneys otherwise normal.  3. Most likely small benign cysts in the liver.  4. Cholelithiasis.  Kidneys: His right nephrolithiasis with a mid pole calyceal stone measuring 4.3  mm. There is punctate left midpole nephrolithiasis with a 2 mm stone. A benign  cyst is seen in the right kidney measuring 1.4 cm. No hydronephrosis. No  hydroureter. No ureteral lithiasis. There is incomplete opacification of the  ureters. Distal ureters both appear well-opacified and normal. No  hydronephrosis. Both kidneys appear to enhance appropriately and symmetrically  and excrete appropriately and symmetrically.      No prior h/o stone dz    Family Hx of Kidney stones    Review of Systems  Constitutional: Fever:   Skin: Rash:   HEENT: Hearing difficulty:   Eyes: Blurred vision:   Cardiovascular: Chest pain:   Respiratory: Shortness of breath:   Gastrointestinal: Nausea/vomiting:   Musculoskeletal: Back pain:   Neurological: Weakness:   Psychological: Memory loss:   Comments/additional findings:       PMH/PSH/SocHx/FamHx/Meds & allergies reviewed.    Past Medical History   Diagnosis Date   ??? Hemorrhoid    ??? Hypercholesterolemia      Past Surgical History   Procedure Laterality Date   ??? Hx gyn       hysterectomy   ??? Hx colonoscopy       Social History     Social History   ??? Marital status: DIVORCED     Spouse name: N/A   ??? Number of children: N/A   ??? Years of education: N/A     Occupational History   ??? Not on file.     Social History Main Topics   ??? Smoking status: Never Smoker   ??? Smokeless tobacco: Never Used   ??? Alcohol use No   ??? Drug use: No   ??? Sexual activity: Not on file     Other Topics Concern   ??? Not on file     Social History Narrative    ** Merged History Encounter **          Family History   Problem Relation Age of Onset   ??? Hypertension Mother    ??? Elevated Lipids Father    ??? Gout Father    ??? Diabetes Father      Allergies   Allergen Reactions   ??? Alcohol Rash     Current Outpatient Prescriptions on File Prior to  Visit   Medication Sig Dispense Refill   ??? diclofenac potassium (ZIPSOR) 25 mg capsule 25 mg.     ??? HYDROcodone-acetaminophen (NORCO) 5-325 mg per tablet Take 1 Tab by mouth every four (4) hours as needed for Pain. Max Daily Amount: 6 Tabs. 45 Tab 0   ??? simvastatin (ZOCOR) 20 mg tablet Take 10 mg by mouth nightly.     ??? FISH OIL 500 mg cap Take  by mouth.     ??? Cholecalciferol, Vitamin D3, (VITAMIN D3) 1,000 unit cap Take  by mouth.     ??? cinnamon bark (CINNAMON) 500 mg cap Take  by mouth.       No current facility-administered medications on file prior to visit.          Physical Exam:  Visit Vitals   ??? BP 118/62   ??? Ht 5\' 3"  (1.6 m)   ??? Wt 123 lb (55.8 kg)   ??? BMI 21.79 kg/m2     Constitutional: WDWN, Pleasant and appropriate affect, No acute distress.    CV:  RRR  Respiratory: No respiratory distress or difficulties  Abdomen:  No abdominal masses or tenderness.  No CVA tenderness.    GU:  Bladder is nonpalpable.   Skin: No jaundice.    Neuro/Psych:  Alert and oriented x 3. Affect appropriate.    Lymphatic:  No enlarged neck / subclavian lymph nodes.       Urinalysis:   Results for orders placed or performed in visit on 03/12/15   AMB POC URINALYSIS DIP STICK AUTO W/O MICRO   Result Value Ref Range    Color (UA POC) Yellow     Clarity (UA POC) Clear     Glucose (UA POC) Negative Negative    Bilirubin (UA POC) Negative Negative    Ketones (UA POC) Negative Negative    Specific gravity (UA POC) 1.025 1.001 - 1.035    Blood (UA POC) Trace Negative    pH (UA POC) 5.5 4.6 - 8.0    Protein (UA POC) Negative Negative mg/dL    Urobilinogen (UA POC) 0.2 mg/dL 0.2 - 1    Nitrites (UA POC) Negative Negative    Leukocyte esterase (UA POC) Negative Negative           Assessment:   1. Nonobstructive bilateral nephrolithiasis with the largest stone in the right kidney   2. Cholelithiasis  3. Microhematuria  4. Right back pain; resolved currently - suspect her pain is secondary to her gall stone considering the location and characteristics of her pain.  Her 4 mm right sided stone is non-obstructive and I suspect not the cause of her pain.  Her kidney stone was not visible on her KUB today    Plan:   Reviewed recent CTU with Pt    Reviewed today's KUB with Pt- 22mm Gallstone    Increase fluid intake for stone prevention    Recommend f/u with General Surgery regarding Cholelithiasis    RTC for Cystoscopy - to complete hematuria evaluation      VBrugh      Cc:  CHRISTIAN Pamella PertH JOYNER, PA    Documentation provided by Neldon NewportSeth Pekoe, medical scribe for Dr. Oretha EllisVictor Maveryk Renstrom on 03/12/2015.

## 2015-03-12 NOTE — Progress Notes (Signed)
KUB performed today per Dr Brugh

## 2015-03-16 ENCOUNTER — Encounter: Attending: Urology | Primary: Physician Assistant

## 2015-04-05 NOTE — Telephone Encounter (Signed)
Received msg that patient called with concerns that she was diagnosed with a gallstone by her urologist; and was further instructed to follow up with Dr. Nedra HaiLee related to this.    I returned patient's call and notified her that a general surgeon in our practice would be happy to treat her gallbladder (as Dr. Nedra HaiLee is a colon and rectal surgeon) and that we would need a referral from her PCP.  Left VM with office number for patient to return the call.

## 2015-04-22 ENCOUNTER — Ambulatory Visit
Admit: 2015-04-22 | Discharge: 2015-04-22 | Payer: PRIVATE HEALTH INSURANCE | Attending: Surgery | Primary: Physician Assistant

## 2015-04-22 DIAGNOSIS — K8020 Calculus of gallbladder without cholecystitis without obstruction: Secondary | ICD-10-CM

## 2015-04-22 NOTE — Progress Notes (Signed)
General Surgery Consult    Adriyanna Christians Dasco  Admit date: (Not on file)    MRN: [id number]     DOB: 1955/10/19     Age: 60 y.o.        Attending Physician: Earlyne Iba, MD, FACS      History of Present Illness:      Katielynn Horan is a 60 y.o. female who presented with abdominal pain. Her pain is mainly in the right back. She said that she has no nausea or vomiting. She was diagnosed with kidney stones bilateral. She currently has hematuria and she is scheduled for cystoscopy. She had a CT scan that showed cholelithiasis with no evidence of cholecystitis. One of the stones is about 1.8 cm in size.    Patient Active Problem List    Diagnosis Date Noted   ??? Internal and external bleeding hemorrhoids 02/13/2014   ??? Anemia 02/13/2014     Past Medical History   Diagnosis Date   ??? Hemorrhoid    ??? Hypercholesterolemia    ??? Kidney stone       Past Surgical History   Procedure Laterality Date   ??? Hx gyn       hysterectomy   ??? Hx colonoscopy        Social History   Substance Use Topics   ??? Smoking status: Never Smoker   ??? Smokeless tobacco: Never Used   ??? Alcohol use No      History   Smoking Status   ??? Never Smoker   Smokeless Tobacco   ??? Never Used     Family History   Problem Relation Age of Onset   ??? Hypertension Mother    ??? Elevated Lipids Father    ??? Gout Father    ??? Diabetes Father       Current Outpatient Prescriptions   Medication Sig   ??? naproxen (NAPROSYN) 500 mg tablet Take 500 mg by mouth two (2) times daily (with meals).   ??? simvastatin (ZOCOR) 20 mg tablet Take 10 mg by mouth nightly.   ??? FISH OIL 500 mg cap Take  by mouth.   ??? Cholecalciferol, Vitamin D3, (VITAMIN D3) 1,000 unit cap Take  by mouth.   ??? cinnamon bark (CINNAMON) 500 mg cap Take  by mouth.   ??? diclofenac potassium (ZIPSOR) 25 mg capsule 25 mg.   ??? HYDROcodone-acetaminophen (NORCO) 5-325 mg per tablet Take 1 Tab by mouth every four (4) hours as needed for Pain. Max Daily Amount: 6 Tabs.      No current facility-administered medications for this visit.       Allergies   Allergen Reactions   ??? Alcohol Rash        Review of Systems:  Pertinent items are noted in the History of Present Illness.    Objective:     Visit Vitals   ??? BP 126/63 (BP 1 Location: Right arm, BP Patient Position: Sitting)   ??? Pulse 71   ??? Temp 97 ??F (36.1 ??C) (Oral)   ??? Resp 16   ??? Ht '5\' 3"'$  (1.6 m)   ??? Wt 56.8 kg (125 lb 3.2 oz)   ??? SpO2 97%   ??? BMI 22.18 kg/m2       Physical Exam:      General:  in no apparent distress   Eyes:  conjunctivae and sclerae normal, pupils equal, round, reactive to light   Throat & Neck: no erythema or exudates noted and neck  supple and symmetrical; no palpable masses   Lungs:   clear to auscultation bilaterally   Heart:  Regular rate and rhythm   Abdomen:   flat, soft, nontender, nondistended, no masses or organomegaly. No hernias.    Extremities: extremities normal, atraumatic, no cyanosis or edema   Skin: Normal.       Imaging and Lab Review:     CBC:   Lab Results   Component Value Date/Time    WBC 5.9 09/07/2008 01:05 PM    RBC 3.94 09/07/2008 01:05 PM    HGB 12.5 09/07/2008 01:05 PM    HCT 36.6 09/07/2008 01:05 PM    PLATELET 256 09/07/2008 01:05 PM     BMP:   Lab Results   Component Value Date/Time    Glucose 97 01/07/2009 11:27 AM    Sodium 140 01/07/2009 11:27 AM    Potassium 4.6 01/07/2009 11:27 AM    Chloride 103 01/07/2009 11:27 AM    CO2 30 01/07/2009 11:27 AM    BUN 13 01/07/2009 11:27 AM    Creatinine 0.8 01/07/2009 11:27 AM    Calcium 9.3 01/07/2009 11:27 AM     CMP:  Lab Results   Component Value Date/Time    Glucose 97 01/07/2009 11:27 AM    Sodium 140 01/07/2009 11:27 AM    Potassium 4.6 01/07/2009 11:27 AM    Chloride 103 01/07/2009 11:27 AM    CO2 30 01/07/2009 11:27 AM    BUN 13 01/07/2009 11:27 AM    Creatinine 0.8 01/07/2009 11:27 AM    Calcium 9.3 01/07/2009 11:27 AM    Anion gap 7 01/07/2009 11:27 AM    BUN/Creatinine ratio 16 01/07/2009 11:27 AM     Alk. phosphatase 72 01/07/2009 11:27 AM    Protein, total 7.9 01/07/2009 11:27 AM    Albumin 4.4 01/07/2009 11:27 AM    Globulin 3.5 01/07/2009 11:27 AM    A-G Ratio 1.3 01/07/2009 11:27 AM       No results found for this or any previous visit (from the past 24 hour(s)).    images and reports reviewed    Assessment:   Dynastie Knoop is a 60 y.o. female is presenting with cholelithiasis and nonspecific abdominal pain. I am really not sure if her pain is related to her gallstone though it could be a possibility. The patient is not enthusiastic about surgical management so I think the best option is to order a HIDA scan. The patient will also need a colonoscopy done at one pint since she said her last colonoscopy was 11 years ago and she had some constipation and history of hemorrhoids    Plan:     Schedule for a HIDA scan  Followup with me after the results    Please call me if you have any questions (cell phone: [phone])     Signed By: Earlyne Iba, MD     April 22, 2015

## 2015-04-22 NOTE — Progress Notes (Signed)
Patient is a 60 y.o. female who presents for a surgical evaluation of cholelithiasis. She relays having two painful episodes of unknown origin, which she scores as an 8/10 & notes the location of her pain as being between her shoulder blades.

## 2015-04-22 NOTE — Patient Instructions (Signed)
If you have any questions or concerns about today's appointment, the verbal and/or written instructions you were given for follow up care, please call our office at [phone].    [facility]  [address], [address]  [place] [address]    [phone] office  [phone]

## 2015-04-26 ENCOUNTER — Inpatient Hospital Stay: Admit: 2015-04-26 | Payer: PRIVATE HEALTH INSURANCE | Attending: Surgery | Primary: Physician Assistant

## 2015-04-26 DIAGNOSIS — K8020 Calculus of gallbladder without cholecystitis without obstruction: Secondary | ICD-10-CM

## 2015-04-26 MED ORDER — WATER FOR INJECTION, STERILE INJECTION
Freq: Once | INTRAMUSCULAR | Status: AC
Start: 2015-04-26 — End: 2015-04-26
  Administered 2015-04-26: 15:00:00 via INTRAVENOUS

## 2015-04-26 MED ORDER — TECHNETIUM TC 99M MEBROFENIN
Freq: Once | Status: AC
Start: 2015-04-26 — End: 2015-04-26
  Administered 2015-04-26: 15:00:00 via INTRAVENOUS

## 2015-04-26 MED ORDER — SINCALIDE 5 MCG IJ SOLR
5 mcg | Freq: Once | INTRAMUSCULAR | Status: AC
Start: 2015-04-26 — End: 2015-04-26
  Administered 2015-04-26: 15:00:00 via INTRAVENOUS

## 2015-04-26 MED FILL — TECHNETIUM TC 99M MEBROFENIN: Qty: 8.3

## 2015-04-26 MED FILL — KINEVAC 5 MCG SOLUTION FOR INJECTION: 5 mcg | INTRAMUSCULAR | Qty: 5

## 2015-04-26 MED FILL — WATER FOR INJECTION, STERILE INJECTION: INTRAMUSCULAR | Qty: 10

## 2015-05-06 ENCOUNTER — Encounter

## 2015-05-08 ENCOUNTER — Ambulatory Visit
Admit: 2015-05-08 | Discharge: 2015-05-08 | Payer: PRIVATE HEALTH INSURANCE | Attending: Urology | Primary: Physician Assistant

## 2015-05-08 DIAGNOSIS — R3129 Other microscopic hematuria: Secondary | ICD-10-CM

## 2015-05-08 LAB — AMB POC URINALYSIS DIP STICK AUTO W/O MICRO
Bilirubin (UA POC): NEGATIVE
Glucose (UA POC): NEGATIVE
Ketones (UA POC): NEGATIVE
Leukocyte esterase (UA POC): NEGATIVE
Nitrites (UA POC): NEGATIVE
Protein (UA POC): NEGATIVE mg/dL
Specific gravity (UA POC): 1.03 (ref 1.001–1.035)
Urobilinogen (UA POC): 0.2 (ref 0.2–1)
pH (UA POC): 5.5 (ref 4.6–8.0)

## 2015-05-08 NOTE — Progress Notes (Signed)
CYSTOSCOPY PROCEDURE    Patient Name: Laiah Pouncey            Date of Procedure: 05/08/2015       Indications: Hematuria    This procedure has been fully reviewed with the patient, and written informed consent has been obtained.             Procedure:  The patient was placed on the exam table in the appropriate position, and prepped and draped in the normal fashion. 5 ml of 4% Lidocaine gel was placed in the urethra. Once adequate anesthesia was achieved; the flexible cystoscope was placed into the bladder.                     Exam:    Lidocaine Jelly:  yes        Scope: flexible  Meatus: Normal  Urethra: normal: yes  Bladder neck: normal; no  Trigone: normal: no  Trabeculation:no  Diverticuli: no  Lesion:yes - 7 mm patch of erythematous bladder mucosa at the right bladder base    UA:   Results for orders placed or performed in visit on 03/12/15   AMB POC URINALYSIS DIP STICK AUTO W/O MICRO   Result Value Ref Range    Color (UA POC) Yellow     Clarity (UA POC) Clear     Glucose (UA POC) Negative Negative    Bilirubin (UA POC) Negative Negative    Ketones (UA POC) Negative Negative    Specific gravity (UA POC) 1.025 1.001 - 1.035    Blood (UA POC) Trace Negative    pH (UA POC) 5.5 4.6 - 8.0    Protein (UA POC) Negative Negative mg/dL    Urobilinogen (UA POC) 0.2 mg/dL 0.2 - 1    Nitrites (UA POC) Negative Negative    Leukocyte esterase (UA POC) Negative Negative         Impression:  1. Microhematuria  2. Nonobstructive bilateral nephrolithiasis with the largest stone in the right kidney noted on CT- Urogram in 01/2015.   3. Cholelithiasis  4. Right back pain; resolved currently - suspect her pain is secondary to her gall stone considering the location and characteristics of her pain. Her 4 mm right sided stone is non-obstructive and I suspect not the cause of her pain. Her kidney stone was not visible on her KUB today    Plan:    See office note   Cystoscopy today showed 7 mm patch of erythematous bladder mucosa at the right bladder base    Antibiotic provided: Cipro  1 tab    __________________________________  Physician:  Oretha Ellis, MD         Documentation provided by Shanda Bumps, medical scribe for Shanda Bumps

## 2015-05-08 NOTE — Progress Notes (Signed)
Chief Complaint   Patient presents with   ??? Microscopic Hematuria     Patient is here for cystoscopy.   ??? Kidney Stone       Darlene Espinoza is a 60 y.o. female who was referred by Dr. Fransisco Hertz for cystoscopy to assess etiology of microhematuria.    Her cysto 05/08/15 showed a area of erythematous mucosa    UA with microscopic exam on 01/17/15 showed 3-5 RBC per HPF with no signs of infection. CT- Urogram from 01/2015 showed bilateral non-obstructing kidney stones, largest 4mm in the right kidney. That stone is not visible on KUB possibly secondary to it being obscured by her gallstone    She also has intermittent upper back pain which was not consistent with renal colic. This was assessed by general surgery due to having a large (2.2cm) gallbladder stone. Patient is s/p HIDA scan and symptoms were reproduced during CCK infusion.     Denies fever / chills / nausea / vomiting/ gross hematuria.    Voiding symptoms:  Reports: 4-5x per night nocturia  Does have urinary frequency  Does not have urinary urgency  Does not have urinary hesitancy  Does not have dysuria  Does not  have the sense of PVR  Good FOS  History of microhematuria    KUB 03/12/15:  Impression:  2.2cm RUQ stone c/w known Gallstone  No renal stones seen on today's study    CT AP 11/28/216  Impression:  1. Nonobstructive bilateral nephrolithiasis with the largest stone in the right  kidney.  2. Benign right renal cyst. Kidneys otherwise normal.  3. Most likely small benign cysts in the liver.  4. Cholelithiasis.  Kidneys: His right nephrolithiasis with a mid pole calyceal stone measuring 4.3  mm. There is punctate left midpole nephrolithiasis with a 2 mm stone. A benign  cyst is seen in the right kidney measuring 1.4 cm. No hydronephrosis. No  hydroureter. No ureteral lithiasis. There is incomplete opacification of the  ureters. Distal ureters both appear well-opacified and normal. No  hydronephrosis. Both kidneys appear to enhance appropriately and  symmetrically  and excrete appropriately and symmetrically.      No prior h/o stone dz    Family Hx of Kidney stones    Review of Systems  Constitutional: Fever:   Skin: Rash:   HEENT: Hearing difficulty:   Eyes: Blurred vision:   Cardiovascular: Chest pain:   Respiratory: Shortness of breath:   Gastrointestinal: Nausea/vomiting:   Musculoskeletal: Back pain:   Neurological: Weakness:   Psychological: Memory loss:   Comments/additional findings:       PMH/PSH/SocHx/FamHx/Meds & allergies reviewed.   Past Medical History   Diagnosis Date   ??? Hematuria    ??? Hemorrhoid    ??? Hypercholesterolemia    ??? Kidney stone      Past Surgical History   Procedure Laterality Date   ??? Hx gyn       hysterectomy   ??? Hx colonoscopy       Social History     Social History   ??? Marital status: DIVORCED     Spouse name: N/A   ??? Number of children: N/A   ??? Years of education: N/A     Occupational History   ??? [employer]      Social History Main Topics   ??? Smoking status: Never Smoker   ??? Smokeless tobacco: Never Used   ??? Alcohol use No   ??? Drug use: No   ??? Sexual  activity: Not on file     Other Topics Concern   ??? Not on file     Social History Narrative    ** Merged History Encounter **          Family History   Problem Relation Age of Onset   ??? Hypertension Mother    ??? Elevated Lipids Father    ??? Gout Father    ??? Diabetes Father      Allergies   Allergen Reactions   ??? Alcohol Rash     Current Outpatient Prescriptions on File Prior to Visit   Medication Sig Dispense Refill   ??? naproxen (NAPROSYN) 500 mg tablet Take 500 mg by mouth two (2) times daily (with meals).     ??? diclofenac potassium (ZIPSOR) 25 mg capsule 25 mg.     ??? HYDROcodone-acetaminophen (NORCO) 5-325 mg per tablet Take 1 Tab by mouth every four (4) hours as needed for Pain. Max Daily Amount: 6 Tabs. 45 Tab 0   ??? simvastatin (ZOCOR) 20 mg tablet Take 10 mg by mouth nightly.     ??? FISH OIL 500 mg cap Take  by mouth.      ??? Cholecalciferol, Vitamin D3, (VITAMIN D3) 1,000 unit cap Take  by mouth.     ??? cinnamon bark (CINNAMON) 500 mg cap Take  by mouth.       No current facility-administered medications on file prior to visit.          Physical Exam:  Visit Vitals   ??? BP 120/80   ??? Ht 5\' 3"  (1.6 m)   ??? Wt 125 lb (56.7 kg)   ??? BMI 22.14 kg/m2     Constitutional: WDWN, Pleasant and appropriate affect, No acute distress.    CV:  RRR  Respiratory: No respiratory distress or difficulties  Abdomen:  No abdominal masses or tenderness.  No CVA tenderness.    GU:  Bladder is nonpalpable.   Skin: No jaundice.    Neuro/Psych:  Alert and oriented x 3. Affect appropriate.   Lymphatic:  No enlarged neck / subclavian lymph nodes.       Urinalysis:   Results for orders placed or performed in visit on 05/08/15   URINE C&S PRE-OP   Result Value Ref Range    FINAL REPORT Microbiology results    AMB POC URINALYSIS DIP STICK AUTO W/O MICRO   Result Value Ref Range    Color (UA POC) Yellow     Clarity (UA POC) Clear     Glucose (UA POC) Negative Negative    Bilirubin (UA POC) Negative Negative    Ketones (UA POC) Negative Negative    Specific gravity (UA POC) 1.030 1.001 - 1.035    Blood (UA POC) Trace Negative    pH (UA POC) 5.5 4.6 - 8.0    Protein (UA POC) Negative Negative mg/dL    Urobilinogen (UA POC) 0.2 mg/dL 0.2 - 1    Nitrites (UA POC) Negative Negative    Leukocyte esterase (UA POC) Negative Negative           Assessment:   1. Microhematuria - UA with microscopic exam on 01/17/15 showed 3-5 RBC per HPF with no signs of infection. CT- Urogram from 01/2015 showed bilateral non-obstructing kidney stones, largest 4mm in the right kidney. No masses  2. Nonobstructive bilateral nephrolithiasis with the largest stone in the right kidney noted on CT- Urogram in 01/2015  3. Cholelithiasis  4. Right back pain; resolved currently -  suspect her pain is secondary to her gall stone considering the location and characteristics of her pain.  Patient is s/p HIDA scan and symptoms were reproduced during CCK infusion.       Plan:     Urine cx    Cystoscopy today showed small patch of erythematous bladder mucosa at the right bladder base    Discussed risks of biopsy including bleeding, infection, injury to bladder and need for foley catheter.  Questions answered    We discussed no Rx for her kidney stone a this time    VBrugh        Cc:  CHRISTIAN Pamella Pert, PA      Documentation provided by Shanda Bumps, medical scribe for Shanda Bumps

## 2015-05-10 LAB — URINE C&S PRE-OP

## 2015-05-13 ENCOUNTER — Inpatient Hospital Stay: Admit: 2015-05-13 | Payer: PRIVATE HEALTH INSURANCE | Attending: Family Medicine | Primary: Physician Assistant

## 2015-05-13 DIAGNOSIS — Z1231 Encounter for screening mammogram for malignant neoplasm of breast: Secondary | ICD-10-CM

## 2015-05-13 NOTE — Telephone Encounter (Signed)
Called patient and left a voicemail stating I was calling to schedule her for a procedure. I left my contact information asking patient to please return my phone call to schedule.

## 2015-05-15 ENCOUNTER — Encounter

## 2015-05-16 ENCOUNTER — Encounter

## 2015-06-10 ENCOUNTER — Inpatient Hospital Stay: Admit: 2015-06-10 | Payer: PRIVATE HEALTH INSURANCE | Primary: Physician Assistant

## 2015-06-10 ENCOUNTER — Inpatient Hospital Stay: Admit: 2015-06-10 | Primary: Physician Assistant

## 2015-06-10 ENCOUNTER — Ambulatory Visit: Payer: PRIVATE HEALTH INSURANCE | Primary: Physician Assistant

## 2015-06-10 DIAGNOSIS — R3129 Other microscopic hematuria: Secondary | ICD-10-CM

## 2015-06-10 DIAGNOSIS — Z01818 Encounter for other preprocedural examination: Secondary | ICD-10-CM

## 2015-06-10 LAB — SENTARA SPECIMEN COLLN.

## 2015-06-10 NOTE — Telephone Encounter (Signed)
Returned Architectural technologistpatient's voicemail. Called patient and left a voicemail stating I was returning her phone call to answer her question. Left my contact information for patient to return my phone call.

## 2015-06-11 LAB — CBC W/O DIFF
HCT: 37.3 % (ref 35.1–48.0)
HGB: 12.4 g/dL (ref 11.7–16.0)
MCH: 31 pg (ref 26–34)
MCHC: 33 g/dL (ref 32–36)
MCV: 93 fL (ref 80–95)
MPV: 11.1 fL — ABNORMAL HIGH (ref 6.0–10.8)
PLATELET: 236 10*3/uL (ref 140–440)
RBC: 4.02 M/uL (ref 3.80–5.20)
RDW: 13.2 % (ref 10.0–16.0)
WBC: 5.2 10*3/uL (ref 4.0–11.0)

## 2015-06-11 LAB — EKG, 12 LEAD, INITIAL
Atrial Rate: 66 {beats}/min
Calculated P Axis: 77 degrees
Calculated R Axis: 71 degrees
Calculated T Axis: 44 degrees
Diagnosis: NORMAL
P-R Interval: 144 ms
Q-T Interval: 424 ms
QRS Duration: 84 ms
QTC Calculation (Bezet): 444 ms
Ventricular Rate: 66 {beats}/min

## 2015-06-12 LAB — METABOLIC PANEL, BASIC
Anion gap: 18 mmol/L
BUN: 13 mg/dL (ref 6–22)
CO2: 25 mmol/L (ref 20–32)
Calcium: 9.8 mg/dL (ref 8.4–10.5)
Chloride: 100 mmol/L (ref 98–110)
Creatinine: 0.5 mg/dL — ABNORMAL LOW (ref 0.8–1.4)
GFRAA: >60 (ref >60.0)
GFRNA: >60 (ref >60.0)
Glucose: 98 mg/dL (ref 65–99)
Potassium: 5 mmol/L (ref 3.5–5.5)
Sodium: 143 mmol/L (ref 133–145)

## 2015-06-12 LAB — CULTURE, URINE: CULTURE RESULT: NORMAL

## 2015-06-20 ENCOUNTER — Inpatient Hospital Stay: Payer: PRIVATE HEALTH INSURANCE

## 2015-06-20 MED ORDER — FLUMAZENIL 0.1 MG/ML IV SOLN
0.1 mg/mL | INTRAVENOUS | Status: DC | PRN
Start: 2015-06-20 — End: 2015-06-20

## 2015-06-20 MED ORDER — OXYCODONE-ACETAMINOPHEN 5 MG-325 MG TAB
5-325 mg | ORAL | Status: DC | PRN
Start: 2015-06-20 — End: 2015-06-20

## 2015-06-20 MED ORDER — ONDANSETRON (PF) 4 MG/2 ML INJECTION
4 mg/2 mL | INTRAMUSCULAR | Status: DC | PRN
Start: 2015-06-20 — End: 2015-06-20
  Administered 2015-06-20: 14:00:00 via INTRAVENOUS

## 2015-06-20 MED ORDER — FENTANYL CITRATE (PF) 50 MCG/ML IJ SOLN
50 mcg/mL | INTRAMUSCULAR | Status: DC | PRN
Start: 2015-06-20 — End: 2015-06-20
  Administered 2015-06-20: 14:00:00 via INTRAVENOUS

## 2015-06-20 MED ORDER — MIDAZOLAM 1 MG/ML IJ SOLN
1 mg/mL | INTRAMUSCULAR | Status: DC | PRN
Start: 2015-06-20 — End: 2015-06-20
  Administered 2015-06-20 (×2): via INTRAVENOUS

## 2015-06-20 MED ORDER — CIPROFLOXACIN 500 MG TAB
500 mg | ORAL_TABLET | Freq: Two times a day (BID) | ORAL | 0 refills | Status: AC
Start: 2015-06-20 — End: 2015-06-23

## 2015-06-20 MED ORDER — DEXAMETHASONE SODIUM PHOSPHATE 4 MG/ML IJ SOLN
4 mg/mL | INTRAMUSCULAR | Status: DC | PRN
Start: 2015-06-20 — End: 2015-06-20
  Administered 2015-06-20: 14:00:00 via INTRAVENOUS

## 2015-06-20 MED ORDER — FENTANYL CITRATE (PF) 50 MCG/ML IJ SOLN
50 mcg/mL | INTRAMUSCULAR | Status: DC
Start: 2015-06-20 — End: 2015-06-20

## 2015-06-20 MED ORDER — LABETALOL 5 MG/ML IV SYRINGE
20 mg/4 mL (5 mg/mL) | INTRAVENOUS | Status: DC | PRN
Start: 2015-06-20 — End: 2015-06-20

## 2015-06-20 MED ORDER — SCOPOLAMINE (1.3-1.5) MG 72 HR TRANSDERM PATCH
1 mg over 3 days | TRANSDERMAL | Status: DC
Start: 2015-06-20 — End: 2015-06-20

## 2015-06-20 MED ORDER — LACTATED RINGERS IV
INTRAVENOUS | Status: DC
Start: 2015-06-20 — End: 2015-06-20

## 2015-06-20 MED ORDER — LIDOCAINE (PF) 20 MG/ML (2 %) IJ SOLN
20 mg/mL (2 %) | INTRAMUSCULAR | Status: DC | PRN
Start: 2015-06-20 — End: 2015-06-20
  Administered 2015-06-20: 13:00:00 via INTRAVENOUS

## 2015-06-20 MED ORDER — BELLADONNA ALKALOIDS-OPIUM 16.2 MG-60 MG RECTAL SUPPOSITORY
RECTAL | Status: DC | PRN
Start: 2015-06-20 — End: 2015-06-20
  Administered 2015-06-20: 14:00:00 via RECTAL

## 2015-06-20 MED ORDER — HYDRALAZINE 20 MG/ML IJ SOLN
20 mg/mL | INTRAMUSCULAR | Status: DC | PRN
Start: 2015-06-20 — End: 2015-06-20

## 2015-06-20 MED ORDER — HALOPERIDOL LACTATE 5 MG/ML IJ SOLN
5 mg/mL | INTRAMUSCULAR | Status: DC | PRN
Start: 2015-06-20 — End: 2015-06-20
  Administered 2015-06-20: 14:00:00 via INTRAVENOUS

## 2015-06-20 MED ORDER — HYDROMORPHONE (PF) 1 MG/ML IJ SOLN
1 mg/mL | INTRAMUSCULAR | Status: DC | PRN
Start: 2015-06-20 — End: 2015-06-20

## 2015-06-20 MED ORDER — ONDANSETRON (PF) 4 MG/2 ML INJECTION
4 mg/2 mL | Freq: Once | INTRAMUSCULAR | Status: DC | PRN
Start: 2015-06-20 — End: 2015-06-20

## 2015-06-20 MED ORDER — PROPOFOL 10 MG/ML IV EMUL
10 mg/mL | INTRAVENOUS | Status: DC | PRN
Start: 2015-06-20 — End: 2015-06-20
  Administered 2015-06-20 (×3): via INTRAVENOUS

## 2015-06-20 MED ORDER — CIPROFLOXACIN IN D5W 400 MG/200 ML IV PIGGY BACK
400 mg/200 mL | Freq: Once | INTRAVENOUS | Status: AC
Start: 2015-06-20 — End: 2015-06-20
  Administered 2015-06-20: 13:00:00 via INTRAVENOUS

## 2015-06-20 MED ORDER — DIPHENHYDRAMINE HCL 50 MG/ML IJ SOLN
50 mg/mL | INTRAMUSCULAR | Status: DC | PRN
Start: 2015-06-20 — End: 2015-06-20

## 2015-06-20 MED ORDER — NALOXONE 0.4 MG/ML INJECTION
0.4 mg/mL | INTRAMUSCULAR | Status: DC | PRN
Start: 2015-06-20 — End: 2015-06-20

## 2015-06-20 MED ORDER — BELLADONNA ALKALOIDS-OPIUM 16.2 MG-60 MG RECTAL SUPPOSITORY
RECTAL | Status: DC
Start: 2015-06-20 — End: 2015-06-20

## 2015-06-20 MED ORDER — MIDAZOLAM 1 MG/ML IJ SOLN
1 mg/mL | INTRAMUSCULAR | Status: DC
Start: 2015-06-20 — End: 2015-06-20

## 2015-06-20 MED ORDER — HYDROCODONE-ACETAMINOPHEN 5 MG-325 MG TAB
5-325 mg | ORAL_TABLET | Freq: Four times a day (QID) | ORAL | 0 refills | Status: DC | PRN
Start: 2015-06-20 — End: 2015-07-22

## 2015-06-20 MED ORDER — SCOPOLAMINE (1.3-1.5) MG 72 HR TRANSDERM PATCH
1 mg over 3 days | TRANSDERMAL | Status: AC
Start: 2015-06-20 — End: 2015-06-20

## 2015-06-20 MED ORDER — LACTATED RINGERS IV
INTRAVENOUS | Status: DC
Start: 2015-06-20 — End: 2015-06-20
  Administered 2015-06-20: 13:00:00 via INTRAVENOUS

## 2015-06-20 MED ORDER — FENTANYL CITRATE (PF) 50 MCG/ML IJ SOLN
50 mcg/mL | INTRAMUSCULAR | Status: DC | PRN
Start: 2015-06-20 — End: 2015-06-20

## 2015-06-20 MED ORDER — EPHEDRINE SULFATE 50 MG/ML IJ SOLN
50 mg/mL | INTRAMUSCULAR | Status: DC | PRN
Start: 2015-06-20 — End: 2015-06-20
  Administered 2015-06-20: 14:00:00 via INTRAVENOUS

## 2015-06-20 MED ORDER — LACTATED RINGERS IV
INTRAVENOUS | Status: DC | PRN
Start: 2015-06-20 — End: 2015-06-20
  Administered 2015-06-20: 13:00:00 via INTRAVENOUS

## 2015-06-20 MED ORDER — INSULIN REGULAR HUMAN 100 UNIT/ML INJECTION
100 unit/mL | Freq: Once | INTRAMUSCULAR | Status: DC | PRN
Start: 2015-06-20 — End: 2015-06-20

## 2015-06-20 MED FILL — LABETALOL 5 MG/ML IV SYRINGE: 20 mg/4 mL (5 mg/mL) | INTRAVENOUS | Qty: 4

## 2015-06-20 MED FILL — TRANSDERM-SCOP 1 MG OVER 3 DAYS TRANSDERMAL PATCH: 1 mg over 3 days | TRANSDERMAL | Qty: 1

## 2015-06-20 MED FILL — HYDROMORPHONE (PF) 1 MG/ML IJ SOLN: 1 mg/mL | INTRAMUSCULAR | Qty: 1

## 2015-06-20 MED FILL — FENTANYL CITRATE (PF) 50 MCG/ML IJ SOLN: 50 mcg/mL | INTRAMUSCULAR | Qty: 2

## 2015-06-20 MED FILL — INSULIN REGULAR HUMAN 100 UNIT/ML INJECTION: 100 unit/mL | INTRAMUSCULAR | Qty: 1

## 2015-06-20 MED FILL — CIPROFLOXACIN IN D5W 400 MG/200 ML IV PIGGY BACK: 400 mg/200 mL | INTRAVENOUS | Qty: 200

## 2015-06-20 MED FILL — BELLADONNA ALKALOIDS-OPIUM 16.2 MG-60 MG RECTAL SUPPOSITORY: RECTAL | Qty: 1

## 2015-06-20 MED FILL — LACTATED RINGERS IV: INTRAVENOUS | Qty: 1000

## 2015-06-20 MED FILL — MIDAZOLAM 1 MG/ML IJ SOLN: 1 mg/mL | INTRAMUSCULAR | Qty: 2

## 2015-06-20 NOTE — Op Note (Signed)
[facility]  Operation Report  NAME:  DASCO, Darlene Espinoza  SEX:   F  DATE: 06/20/2015  DOB: 01-26-1956  MR#    [id number]  ROOM:  PL  ACCT#  [id number]    cc: Elayne Guerinhristian Joyner PA    PREOPERATIVE DIAGNOSIS:  Microscopic hematuria and bladder lesion.    POSTOPERATIVE DIAGNOSIS:  Microscopic hematuria and bladder lesion.    PROCEDURE:  Cystoscopy and bladder biopsy.    SURGEON:  Oretha EllisVictor Janeil Schexnayder, MD    SURGICAL ASSISTANT:  None.    ANESTHESIA:  General.     ANESTHESIOLOGIST:   Dr. Donata ClayMatthew Schlossberg.     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    SPECIMENS:  1.  Right bladder dome biopsy.   2.  Left posterior bladder wall biopsy.    COMPLICATIONS:  None.    ESTIMATED BLOOD LOSS:  Minimal.    HISTORY:  The patient is a [age] year old female with a history of microscopic hematuria.  She had a CT scan which revealed a 4.3 mm right-sided mid pole calyceal stone as well as a punctate left mid pole stone as well as finally a benign right-sided renal cyst.  Cystoscopy in the office revealed a patch of erythematous bladder mucosa at the right bladder base.  We discussed risks and benefits of cystoscopy and bladder biopsy of that erythematous area.  She understood these risks and benefits and desired to proceed.    DESCRIPTION OF PROCEDURE:  After informed consent was obtained, the patient taken to the operating room and placed on the operating table in the supine position.  SCDs were placed for DVT prophylaxis and she received 400 mg of IV Cipro preoperatively for perioperative prophylactic antibiotics.  General anesthesia was induced.  After adequate anesthesia was obtained, the patient was placed in lithotomy position.  Her perineum and genitalia were prepped and draped in the usual sterile fashion.  A timeout was called.  After confirming timeout, cystoscopy was performed using a  21-French rigid cystoscope.  This was placed per the urethra in the urinary bladder under direct vision.  The urethra was normal throughout its course.  The bladder was inspected throughout its entirety.  The right and left ureteral orifices were identified in normal appearance, orthotopic in location and effluxing clear urine.  There was a patch of erythematous bladder mucosa approximately 5 mm involving the right bladder dome.  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  Using cold cup biopsy forceps, each of these lesions was biopsied and sent for permanent pathologic review labeled as right bladder dome and left posterior bladder wall biopsies.  Each site was cauterized using the Bugbee electrode.  After hemostasis was confirmed with both bladder decompressed as well as filled, the bladder was emptied.  The patient was awakened from general anesthesia, returned to recovery in satisfactory condition.  She tolerated the procedure well.      ___________________  Dell PontoVictor M Simara Rhyner MD  Dictated By:.   JMB  D:06/20/2015 10:03:48  T: 06/20/2015 11:05:19  [id number]

## 2015-06-20 NOTE — Op Note (Signed)
[facility]  Operation Report  NAME:  Darlene Espinoza, [name]  SEX:   F  DATE: 06/20/2015  DOB: 1955/04/11  MR#    [id number]  ROOM:  PL  ACCT#  [id number]    cc: Elayne Guerinhristian Joyner PA    PREOPERATIVE DIAGNOSIS:  Microscopic hematuria and bladder lesion.    POSTOPERATIVE DIAGNOSIS:  Microscopic hematuria and bladder lesion.    PROCEDURE:  Cystoscopy and bladder biopsy.    SURGEON:  Oretha EllisVictor Cloria Ciresi, MD    SURGICAL ASSISTANT:  None.    ANESTHESIA:  General.     ANESTHESIOLOGIST:   Dr. Donata ClayMatthew Schlossberg.     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    SPECIMENS:  1.  Right bladder dome biopsy.   2.  Left posterior bladder wall biopsy.    COMPLICATIONS:  None.    ESTIMATED BLOOD LOSS:  Minimal.    HISTORY:  The patient is a 60 year old female with a history of microscopic hematuria.  She had a CT scan which revealed a 4.3 mm right-sided mid pole calyceal stone as well as a punctate left mid pole stone as well as finally a benign right-sided renal cyst.  Cystoscopy in the office revealed a patch of erythematous bladder mucosa at the right bladder base.  We discussed risks and benefits of cystoscopy and bladder biopsy of that erythematous area.  She understood these risks and benefits and desired to proceed.    DESCRIPTION OF PROCEDURE:  After informed consent was obtained, the patient taken to the operating room and placed on the operating table in the supine position.  SCDs were placed for DVT prophylaxis and she received 400 mg of IV Cipro preoperatively for perioperative prophylactic antibiotics.  General anesthesia was induced.  After adequate anesthesia was obtained, the patient was placed in lithotomy position.  Her perineum and genitalia were prepped and draped in the usual sterile fashion.  A timeout was called.   After confirming timeout, cystoscopy was performed using a 21-French rigid cystoscope.  This was placed per the urethra in the urinary bladder under direct vision.  The urethra was normal throughout its course.  The bladder was inspected throughout its entirety.  The right and left ureteral orifices were identified in normal appearance, orthotopic in location and effluxing clear urine.  There was a patch of erythematous bladder mucosa approximately 5 mm involving the right bladder dome.  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  Using cold cup biopsy forceps, each of these lesions was biopsied and sent for permanent pathologic review labeled as right bladder dome and left posterior bladder wall biopsies.  Each site was cauterized using the Bugbee electrode.  After hemostasis was confirmed with both bladder decompressed as well as filled, the bladder was emptied.  The patient was awakened from general anesthesia, returned to recovery in satisfactory condition.  She tolerated the procedure well.      ___________________  Dell PontoVictor M Malyiah Fellows MD  Dictated By:.   JMB  D:06/20/2015 10:03:48  T: 06/20/2015 11:05:19  [id number]

## 2015-06-20 NOTE — H&P (Signed)
Urology H and P    Chief Complaint   Patient presents with   ??? Microscopic Hematuria   ?? ?? Patient is here for cystoscopy.   ??? Kidney Stone   ??  ??  Darlene Espinoza is a 60 y.o. female who was referred by Dr. Fransisco Hertz for cystoscopy to assess etiology of microhematuria.  ??  Her cysto 05/08/15 showed a area of erythematous mucosa    Cysto 05/08/2015     Lidocaine Jelly:  yes Scope: flexible  Meatus: Normal  Urethra: normal: yes  Bladder neck: normal; no  Trigone: normal: no  Trabeculation:no  Diverticuli: no  Lesion:yes - 7 mm patch of erythematous bladder mucosa at the right bladder base  ??  UA with microscopic exam on 01/17/15 showed 3-5 RBC per HPF with no signs of infection. CT- Urogram from 01/2015 showed bilateral non-obstructing kidney stones, largest 4mm in the right kidney. That stone is not visible on KUB possibly secondary to it being obscured by her gallstone  ??  She also has intermittent upper back pain which was not consistent with renal colic. This was assessed by general surgery due to having a large (2.2cm) gallbladder stone. Patient is s/p HIDA scan and symptoms were reproduced during CCK infusion.    Denies fever / chills / nausea / vomiting/ gross hematuria.  ??  Voiding symptoms:  Reports: 4-5x per night nocturia  Does have urinary frequency  Does not have urinary urgency  Does not have urinary hesitancy  Does not have dysuria  Does not  have the sense of PVR  Good FOS  History of microhematuria  ??  KUB 03/12/15:  Impression:  2.2cm RUQ stone c/w known Gallstone  No renal stones seen on today's study  ??  CT AP 11/28/216  Impression:  1. Nonobstructive bilateral nephrolithiasis with the largest stone in the right  kidney.  2. Benign right renal cyst. Kidneys otherwise normal.  3. Most likely small benign cysts in the liver.  4. Cholelithiasis.  Kidneys: His right nephrolithiasis with a mid pole calyceal stone measuring 4.3  mm. There is punctate left midpole nephrolithiasis with a 2 mm stone. A  benign  cyst is seen in the right kidney measuring 1.4 cm. No hydronephrosis. No  hydroureter. No ureteral lithiasis. There is incomplete opacification of the  ureters. Distal ureters both appear well-opacified and normal. No  hydronephrosis. Both kidneys appear to enhance appropriately and symmetrically  and excrete appropriately and symmetrically.  ??  ??  No prior h/o stone dz  ??  Family Hx of Kidney stones  ??  Review of Systems  Constitutional: Fever:   Skin: Rash:   HEENT: Hearing difficulty:   Eyes: Blurred vision:   Cardiovascular: Chest pain:   Respiratory: Shortness of breath:   Gastrointestinal: Nausea/vomiting:   Musculoskeletal: Back pain:   Neurological: Weakness:   Psychological: Memory loss:   Comments/additional findings:   ??  ??  PMH/PSH/SocHx/FamHx/Meds & allergies reviewed.        Past Medical History   Diagnosis Date   ??? Hematuria ??   ??? Hemorrhoid ??   ??? Hypercholesterolemia ??   ??? Kidney stone ??   ??         Past Surgical History   Procedure Laterality Date   ??? Hx gyn ?? ??   ?? ?? hysterectomy   ??? Hx colonoscopy ?? ??   ??  Social History   ??        Social History   ???  Marital status: DIVORCED   ?? ?? Spouse name: N/A   ??? Number of children: N/A   ??? Years of education: N/A   ??       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   ??       Social History Main Topics   ??? Smoking status: Never Smoker   ??? Smokeless tobacco: Never Used   ??? Alcohol use No   ??? Drug use: No   ??? Sexual activity: Not on file   ??       Other Topics Concern   ??? Not on file   ??      Social History Narrative   ?? ** Merged History Encounter **   ?? ????   ??        Family History   Problem Relation Age of Onset   ??? Hypertension Mother ??   ??? Elevated Lipids Father ??   ??? Gout Father ??   ??? Diabetes Father ??   ??       Allergies   Allergen Reactions   ??? Alcohol Rash   ??         Current Outpatient Prescriptions on File Prior to Visit   Medication Sig Dispense Refill   ??? naproxen (NAPROSYN) 500 mg tablet Take 500 mg by mouth two (2) times  daily (with meals). ?? ??   ??? diclofenac potassium (ZIPSOR) 25 mg capsule 25 mg. ?? ??   ??? HYDROcodone-acetaminophen (NORCO) 5-325 mg per tablet Take 1 Tab by mouth every four (4) hours as needed for Pain. Max Daily Amount: 6 Tabs. 45 Tab 0   ??? simvastatin (ZOCOR) 20 mg tablet Take 10 mg by mouth nightly. ?? ??   ??? FISH OIL 500 mg cap Take by mouth. ?? ??   ??? Cholecalciferol, Vitamin D3, (VITAMIN D3) 1,000 unit cap Take by mouth. ?? ??   ??? cinnamon bark (CINNAMON) 500 mg cap Take by mouth. ?? ??   ??  No current facility-administered medications on file prior to visit.    ??  ??  ??  Physical Exam:       Visit Vitals   ??? BP 120/80   ??? Ht  (1.6 m)   ??? Wt 125 lb (56.7 kg)   ??? BMI 22.14 kg/m2   ??  Constitutional: WDWN, Pleasant and appropriate affect, No acute distress.   CV: RRR  Respiratory: No respiratory distress or difficulties  Abdomen: No abdominal masses or tenderness.  No CVA tenderness.    GU: Bladder is nonpalpable.   Skin: No jaundice.   Neuro/Psych: Alert and oriented x 3. Affect appropriate.   Lymphatic: No enlarged neck / subclavian lymph nodes.   ??  Urinalysis:         Results for orders placed or performed in visit on 05/08/15   URINE C&S PRE-OP   Result Value Ref Range   ?? FINAL REPORT Microbiology results ??   AMB POC URINALYSIS DIP STICK AUTO W/O MICRO   Result Value Ref Range   ?? Color (UA POC) Yellow ??   ?? Clarity (UA POC) Clear ??   ?? Glucose (UA POC) Negative Negative   ?? Bilirubin (UA POC) Negative Negative   ?? Ketones (UA POC) Negative Negative   ?? Specific gravity (UA POC) 1.030 1.001 - 1.035   ?? Blood (UA POC) Trace Negative   ?? pH (UA POC) 5.5 4.6 - 8.0   ?? Protein (UA POC)  Negative Negative mg/dL   ?? Urobilinogen (UA POC) 0.2 mg/dL 0.2 - 1   ?? Nitrites (UA POC) Negative Negative   ?? Leukocyte esterase (UA POC) Negative Negative   ??  ??  ??  ??  Assessment:   1. Microhematuria - UA with microscopic exam on 01/17/15 showed 3-5 RBC per HPF with no signs of infection. CT- Urogram from 01/2015 showed  bilateral non-obstructing kidney stones, largest 4mm in the right kidney. No masses  2. Nonobstructive bilateral nephrolithiasis with the largest stone in the right kidney noted on CT- Urogram in 01/2015  3. Cholelithiasis  4. Right back pain; resolved currently - suspect her pain is secondary to her gall stone considering the location and characteristics of her pain. Patient is s/p HIDA scan and symptoms were reproduced during CCK infusion.   ??  ??  Plan:   ??  Urine cx  ??  Cystoscopy today showed small patch of erythematous bladder mucosa at the right bladder base  ??  Discussed risks of biopsy including bleeding, infection, injury to bladder and need for foley catheter. Questions answered  ??  We discussed no Rx for her kidney stone a this time  ??  VBrugh

## 2015-06-20 NOTE — Anesthesia Post-Procedure Evaluation (Signed)
Post-Anesthesia Evaluation and Assessment    Patient: Darlene HarnessLynn Dasco MRN: [id number]  SSN: [id number]    Date of Birth: [birth year]  Age: 60 y.o.  Sex: female       Cardiovascular Function/Vital Signs  Visit Vitals   ??? BP 100/73 (BP 1 Location: Left arm, BP Patient Position: At rest)   ??? Pulse 60   ??? Temp 36.4 ??C (97.6 ??F)   ??? Resp 14   ??? Ht 5\' 2"  (1.575 m)   ??? Wt 57.6 kg (127 lb)   ??? SpO2 97%   ??? BMI 23.23 kg/m2       Patient is status post general anesthesia for Procedure(s):  CYSTOSCOPY BLADDER  BIOPSY.    Nausea/Vomiting: None    Postoperative hydration reviewed and adequate.    Pain:  Pain Scale 1: Numeric (0 - 10) (06/20/15 1049)  Pain Intensity 1: 0 (06/20/15 1049)   Managed    Neurological Status:   Neuro (WDL): Within Defined Limits (06/20/15 1031)  Neuro  Neurologic State: Alert (eyes closed  resting at times) (06/20/15 1031)  Speech: Clear (06/20/15 1031)   At baseline    Mental Status and Level of Consciousness: Arousable    Pulmonary Status:   O2 Device: Room air (06/20/15 1049)   Adequate oxygenation and airway patent    Complications related to anesthesia: None    Post-anesthesia assessment completed. No concerns      Signed By: Donata ClayMatthew Shanta Dorvil, MD     June 20, 2015

## 2015-06-20 NOTE — Anesthesia Pre-Procedure Evaluation (Signed)
Anesthetic History     PONV          Review of Systems / Medical History  Patient summary reviewed, nursing notes reviewed and pertinent labs reviewed    Pulmonary  Within defined limits                 Neuro/Psych   Within defined limits           Cardiovascular  Within defined limits                Exercise tolerance: >4 METS     GI/Hepatic/Renal  Within defined limits              Endo/Other  Within defined limits        Pertinent negatives: No diabetes   Other Findings              Physical Exam    Airway  Mallampati: I  TM Distance: 4 - 6 cm  Neck ROM: normal range of motion   Mouth opening: Normal     Cardiovascular    Rhythm: regular  Rate: normal         Dental  No notable dental hx       Pulmonary  Breath sounds clear to auscultation               Abdominal  GI exam deferred       Other Findings            Anesthetic Plan    ASA: 2  Anesthesia type: general          Induction: Intravenous  Anesthetic plan and risks discussed with: Patient

## 2015-06-20 NOTE — Brief Op Note (Signed)
BRIEF OPERATIVE NOTE    Date of Procedure: 06/20/2015   Preoperative Diagnosis: MICROHEMATURIA   Postoperative Diagnosis: MICROHEMATURIA     Procedure(s):  CYSTOSCOPY BLADDER  BIOPSY  Surgeon(s) and Role:     * Dell PontoVictor M Annet Manukyan, MD - Primary            Surgical Staff:  Circ-1: Maia Planaroline Whisman, RN  Scrub Tech-1: Balinda QuailsJohn E Delossantos  Event Time In   Incision Start  9:36 AM   Incision Close  9:49 AM     Anesthesia: General   Estimated Blood Loss: minimal (<100 ml)  Specimens:   ID Type Source Tests Collected by Time Destination   1 : right bladder dome biopsy Tissue Bladder AP HISTOLOGY Maia Planaroline Whisman, RN 06/20/2015  9:42 AM    2 : left posterior bladder wall biopsy Tissue Bladder AP HISTOLOGY Maia Planaroline Whisman, RN 06/20/2015  9:42 AM       Findings: small patch (5 mm) of erythematous bladder mucosa on the right bladder dome, and 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

## 2015-06-21 MED FILL — EPHEDRINE SULFATE 50 MG/ML IJ SOLN: 50 mg/mL | INTRAMUSCULAR | Qty: 10

## 2015-06-21 MED FILL — PROPOFOL 10 MG/ML IV EMUL: 10 mg/mL | INTRAVENOUS | Qty: 180

## 2015-06-21 MED FILL — DEXAMETHASONE SODIUM PHOSPHATE 4 MG/ML IJ SOLN: 4 mg/mL | INTRAMUSCULAR | Qty: 4

## 2015-06-21 MED FILL — HALOPERIDOL LACTATE 5 MG/ML IJ SOLN: 5 mg/mL | INTRAMUSCULAR | Qty: 0.5

## 2015-06-21 MED FILL — ONDANSETRON (PF) 4 MG/2 ML INJECTION: 4 mg/2 mL | INTRAMUSCULAR | Qty: 4

## 2015-06-21 MED FILL — XYLOCAINE-MPF 20 MG/ML (2 %) INJECTION SOLUTION: 20 mg/mL (2 %) | INTRAMUSCULAR | Qty: 100

## 2015-06-21 MED FILL — PROPOFOL 10 MG/ML IV EMUL: 10 mg/mL | INTRAVENOUS | Qty: 142.56

## 2015-06-25 NOTE — Telephone Encounter (Signed)
I reviewed Darlene Espinoza's pathology with her by phone    Bladder biopsy 06/20/15    A. ?? ??UROTHELIAL BIOPSY, CLINICALLY RIGHT BLADDER DOME: ??  ???? ?? ?? 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 ?? ?? ?? ??CHANGES.   ???? ?? ?? ??- NEGATIVE FOR DYSPLASIA AND MALIGNANCY.   B. ?? ??UROTHELIAL BIOPSY, CLINICALLY LEFT POSTERIOR BLADDER WALL: ??  ???? ?? ?? 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 ?? ?? ?? ??CHANGES.   ???? ?? ?? ??- NEGATIVE FOR DYSPLASIA AND MALIGNANCY.     She is doing well - no hematuria    She plans to f/u as planned    VBrugh

## 2015-07-12 ENCOUNTER — Inpatient Hospital Stay: Payer: PRIVATE HEALTH INSURANCE | Attending: Family Medicine | Primary: Physician Assistant

## 2015-07-22 ENCOUNTER — Ambulatory Visit
Admit: 2015-07-22 | Discharge: 2015-07-22 | Payer: PRIVATE HEALTH INSURANCE | Attending: Urology | Primary: Physician Assistant

## 2015-07-22 DIAGNOSIS — R3129 Other microscopic hematuria: Secondary | ICD-10-CM

## 2015-07-22 LAB — AMB POC URINALYSIS DIP STICK AUTO W/O MICRO
Bilirubin (UA POC): NEGATIVE
Glucose (UA POC): NEGATIVE
Ketones (UA POC): NEGATIVE
Leukocyte esterase (UA POC): NEGATIVE
Nitrites (UA POC): NEGATIVE
Protein (UA POC): NEGATIVE mg/dL
Specific gravity (UA POC): 1.02 (ref 1.001–1.035)
Urobilinogen (UA POC): 0.2 (ref 0.2–1)
pH (UA POC): 6 (ref 4.6–8.0)

## 2015-07-22 NOTE — Progress Notes (Signed)
Chief Complaint   Patient presents with   ??? Microscopic Hematuria       Darlene Espinoza is a 60 y.o. female who returns today in postOp follow up.     S/p 06/20/15 Bladder Bx secondary to 7 mm patch of erythematous bladder mucosa seen on 05/07/15 Cysto for microhematuria    Bladder Bx Path 06/20/15  A. ?? ??UROTHELIAL BIOPSY, CLINICALLY RIGHT BLADDER DOME: ??  ???? ?? ?? 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 ?? ?? ?? ??CHANGES.   ???? ?? ?? ??- NEGATIVE FOR DYSPLASIA AND MALIGNANCY.   B. ?? ??UROTHELIAL BIOPSY, CLINICALLY LEFT POSTERIOR BLADDER WALL: ??  ???? ?? ?? 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 ?? ?? ?? ??CHANGES.   ???? ?? ?? ??- NEGATIVE FOR DYSPLASIA AND MALIGNANCY.     Postoperatively, Pt is doing well and denies any complications following recent procedure. No n/v/f/c or dysuria.  She details an episode of "pink urine" a few days after surgery, resolved. She also notes abdominal bloating.    She also has intermittent upper back pain which was not consistent with renal colic. This was assessed by general surgery due to having a large (2.2cm) gallbladder stone. Patient is s/p HIDA scan and symptoms were reproduced during CCK infusion.     Denies fever / chills / nausea / vomiting/ gross hematuria.    Voiding symptoms:  Reports: 3-4x per night nocturia  Does have urinary frequency  Does not have urinary urgency  Does not have urinary hesitancy  Does not have dysuria  Does not  have the sense of PVR  Good FOS  History of microhematuria    KUB 03/12/15:  Impression:  2.2cm RUQ stone c/w known Gallstone  No renal stones seen on today's study    CT AP 11/28/216  Impression:  1. Nonobstructive bilateral nephrolithiasis with the largest stone in the right  kidney.  2. Benign right renal cyst. Kidneys otherwise normal.  3. Most likely small benign cysts in the liver.  4. Cholelithiasis.  Kidneys: His right nephrolithiasis with a mid pole calyceal stone measuring 4.3   mm. There is punctate left midpole nephrolithiasis with a 2 mm stone. A benign  cyst is seen in the right kidney measuring 1.4 cm. No hydronephrosis. No  hydroureter. No ureteral lithiasis. There is incomplete opacification of the  ureters. Distal ureters both appear well-opacified and normal. No  hydronephrosis. Both kidneys appear to enhance appropriately and symmetrically  and excrete appropriately and symmetrically.    No prior h/o stone dz    Family Hx of Kidney stones    Review of Systems  Constitutional: Fever: No  Skin: Rash: No  HEENT: Hearing difficulty: No  Eyes: Blurred vision: No  Cardiovascular: Chest pain: No  Respiratory: Shortness of breath: No  Gastrointestinal: Nausea/vomiting: No  Musculoskeletal: Back pain: No  Neurological: Weakness: No  Psychological: Memory loss: No  Comments/additional findings:       PMH/PSH/SocHx/FamHx/Meds & allergies reviewed.   Past Medical History:   Diagnosis Date   ??? Gallstones 01/29/2015   ??? Hematuria    ??? Hemorrhoid    ??? Hypercholesterolemia    ??? Kidney stone    ??? Nausea & vomiting      Past Surgical History:   Procedure Laterality Date   ??? HX COLONOSCOPY     ??? HX GYN      hysterectomy   ??? HX UROLOGICAL  06/20/2015    [facility].  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  Dr. Esmeralda Arthur.       Social History     Social History   ??? Marital status: DIVORCED     Spouse name: N/A   ??? Number of children: N/A   ??? Years of education: N/A     Occupational History   ??? [employer]      Social History Main Topics   ??? Smoking status: Never Smoker   ??? Smokeless tobacco: Never Used   ??? Alcohol use No   ??? Drug use: No   ??? Sexual activity: Not on file     Other Topics Concern   ??? Not on file     Social History Narrative    ** Merged History Encounter **          Family History   Problem Relation Age of Onset   ??? Hypertension Mother    ??? Elevated Lipids Father    ??? Gout Father    ??? Diabetes Father    ??? Breast Cancer Sister       Allergies   Allergen Reactions   ??? Alcohol Rash     Current Outpatient Prescriptions on File Prior to Visit   Medication Sig Dispense Refill   ??? HYDROcodone-acetaminophen (NORCO) 5-325 mg per tablet Take 1-2 Tabs by mouth every six (6) hours as needed for Pain. Max Daily Amount: 8 Tabs. 20 Tab 0   ??? simvastatin (ZOCOR) 20 mg tablet Take 10 mg by mouth every other day.     ??? FISH OIL 500 mg cap Take  by mouth.     ??? Cholecalciferol, Vitamin D3, (VITAMIN D3) 1,000 unit cap Take  by mouth.     ??? cinnamon bark (CINNAMON) 500 mg cap Take  by mouth.       No current facility-administered medications on file prior to visit.          Physical Exam:  Visit Vitals   ??? Ht  (1.575 m)   ??? Wt 127 lb (57.6 kg)   ??? BMI 23.23 kg/m2     Constitutional: WDWN, Pleasant and appropriate affect, No acute distress.    CV:  RRR  Respiratory: No respiratory distress or difficulties  Abdomen:  No abdominal masses or tenderness.  No CVA tenderness.    GU:  Bladder is nonpalpable.   Skin: No jaundice.    Neuro/Psych:  Alert and oriented x 3. Affect appropriate.   Lymphatic:  No enlarged neck / subclavian lymph nodes.       Urinalysis:   Results for orders placed or performed during the hospital encounter of 06/10/15   EKG, 12 LEAD, INITIAL   Result Value Ref Range    Ventricular Rate 66 BPM    Atrial Rate 66 BPM    P-R Interval 144 ms    QRS Duration 84 ms    Q-T Interval 424 ms    QTC Calculation (Bezet) 444 ms    Calculated P Axis 77 degrees    Calculated R Axis 71 degrees    Calculated T Axis 44 degrees    Diagnosis       Normal sinus rhythm  Normal ECG  No previous ECGs available  Confirmed by Earl Lites [id number]) on 06/10/2015 8:26:45 PM             Assessment:   1. Microhematuria - UA with micro 01/17/15 showed 3-5 RBC per HPF with no signs of  infection. CTU 01/2015 showed bilateral non-obstructing kidney stones, largest 4mm in the right kidney. S/p 06/20/15 Bladder Bx with benign findings   2. Nonobstructive bilateral nephrolithiasis with the largest stone in the right kidney noted on CTU 01/2015  3. Cholelithiasis  4. Right back pain; resolved currently - suspect her pain is secondary to her gall stone considering the location and characteristics of her pain. Patient is s/p HIDA scan and symptoms were reproduced during CCK infusion.       Plan:     Reviewed recent Bladder Bx Pathology with Pt- (Negative for Malignancy)    OK to return back to normal routine     F/u 6 months with UA  KUB in 1 year (06/2016)    VBrugh    Cc:  Rosilyn Mings, PA      Documentation provided by Neldon Newport, medical scribe for Dr. Oretha Ellis on 07/22/2015.

## 2015-07-23 ENCOUNTER — Encounter: Attending: Urology | Primary: Physician Assistant

## 2015-07-29 ENCOUNTER — Inpatient Hospital Stay: Admit: 2015-07-29 | Payer: PRIVATE HEALTH INSURANCE | Attending: Family Medicine | Primary: Physician Assistant

## 2015-07-29 DIAGNOSIS — M8580 Other specified disorders of bone density and structure, unspecified site: Secondary | ICD-10-CM

## 2015-11-12 ENCOUNTER — Encounter

## 2015-11-15 ENCOUNTER — Inpatient Hospital Stay: Admit: 2015-11-15 | Payer: PRIVATE HEALTH INSURANCE | Attending: Family Medicine | Primary: Physician Assistant

## 2015-11-15 DIAGNOSIS — K8020 Calculus of gallbladder without cholecystitis without obstruction: Secondary | ICD-10-CM

## 2015-12-09 MED ORDER — OXYBUTYNIN CHLORIDE 5 MG TAB
5 mg | ORAL_TABLET | ORAL | 1 refills | Status: AC
Start: 2015-12-09 — End: ?

## 2015-12-09 NOTE — Telephone Encounter (Addendum)
postop f/u   Received: Yesterday   Message Contents   Robbi Garter, MD  Glory Buff      ??      Arrange Follow up with me in 7 to 10 days for [facility].     Dx: right ureteral stone 5mm -- s/p right Ureteral stent placement 12/08/15      Phoned pt and left a message for her to return call      Patient returned call and was scheduled       Friday, December 20, 2015 01:00 PM f [facility], [id number], Any/Established,   cancel or reschedule   Arrange Follow up with me in 7 to 10 days for [facility]. Edit    Dx: right ureteral stone 5mm -- s/p right Ureteral stent placement 12/08/15

## 2015-12-20 ENCOUNTER — Ambulatory Visit
Admit: 2015-12-20 | Discharge: 2015-12-20 | Payer: PRIVATE HEALTH INSURANCE | Attending: Urology | Primary: Physician Assistant

## 2015-12-20 DIAGNOSIS — N200 Calculus of kidney: Secondary | ICD-10-CM

## 2015-12-20 LAB — AMB POC URINALYSIS DIP STICK AUTO W/O MICRO
Bilirubin (UA POC): NEGATIVE
Glucose (UA POC): NEGATIVE
Ketones (UA POC): NEGATIVE
Nitrites (UA POC): NEGATIVE
Specific gravity (UA POC): 1.03 (ref 1.001–1.035)
Urobilinogen (UA POC): 0.2 (ref 0.2–1)
pH (UA POC): 6 (ref 4.6–8.0)

## 2015-12-20 NOTE — Progress Notes (Signed)
12/20/2015    CC: Kidney stone    Darlene Espinoza is a 60 y.o. Filipino female her for f/u of right distal ureteral stone 5mm   She is S/p Cysto, right JJ stent placement 12/08/15   FINDINGS: Right distal ureteral stone 5 mm    She reports some discomfort with the JJ stent in place but tolerating it fairly well.   She has been experiencing some urinary frequency and feeling of poking, otherwise no pain.     Pt previously seen by Dr. Brugh for microhematuria.   06/20/15 S/p Bladder Bx secondary to 7 mm patch of erythematous bladder mucosa seen on 05/07/15 Cysto for microhematuria  (See path below)    Family h/o stones, Brother  She reports family h/o gallstones (brother and sister)    12/08/15 CT Abd/pelvis:  IMPRESSION:   5 x 4 mm obstructing calculus in the distal right ureter just proximal to the UVJ resulting in moderate hydronephroureter.  Subcentimeter iso-to slightly hyperdense left upper pole renal lesion. ??Solid nodule versus complex cyst. Ultrasound may help further characterize.  Subcentimeter hepatic hypodensity, too small to further characterize.  Small hiatal hernia.  Large gallstone.  Incidental 4 mm right basilar pulmonary nodule. ??A chest CT can be obtained on a nonemergent basis.    Bladder Bx Path 06/20/15  A. ?? ??UROTHELIAL BIOPSY, CLINICALLY RIGHT BLADDER DOME: ??  ???? ?? ?? 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 ?? ?? ?? ??CHANGES.   ???? ?? ?? ??- NEGATIVE FOR DYSPLASIA AND MALIGNANCY.   B. ?? ??UROTHELIAL BIOPSY, CLINICALLY LEFT POSTERIOR BLADDER WALL: ??  ???? ?? ?? 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 ?? ?? ?? ??CHANGES.   ???? ?? ?? ??- NEGATIVE FOR DYSPLASIA AND MALIGNANCY.       AUA sx score: No flowsheet data found.     PMH/PSH/SocHx/FamHx/Meds & allergies reviewed.   Past Medical History:   Diagnosis Date   ??? Constipation    ??? Gallstones 01/29/2015   ??? H/O transurethral destruction of bladder lesion    ??? Hematuria    ??? Hemorrhoid    ??? Hypercholesterolemia     ??? Kidney stone    ??? Microscopic hematuria    ??? Nausea & vomiting      Past Surgical History:   Procedure Laterality Date   ??? HX COLONOSCOPY     ??? HX GYN      hysterectomy   ??? HX UROLOGICAL  06/20/2015    [facility].  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  Dr. Brugh.     ??? HX UROLOGICAL  12/08/2015    CYSTOURETHROSCOPY WITH INDWELLING URETERAL STENT INSERTION; RETROGRADE; Surgeon: Isayah Ignasiak, MD     Social History     Social History   ??? Marital status: DIVORCED     Spouse name: N/A   ??? Number of children: N/A   ??? Years of education: N/A     Occupational History   ??? [employer]      Social History Main Topics   ??? Smoking status: Never Smoker   ??? Smokeless tobacco: Never Used   ??? Alcohol use No   ??? Drug use: No   ??? Sexual activity: Not on file     Other Topics Concern   ??? Not on file     Social History Narrative    ** Merged History Encounter **            Family History   Problem Relation Age of Onset   ??? Hypertension Mother    ??? Elevated Lipids Father    ??? Gout Father    ??? Diabetes Father    ??? Breast Cancer Sister      Allergies   Allergen Reactions   ??? Alcohol Rash     Current Outpatient Prescriptions on File Prior to Visit   Medication Sig Dispense Refill   ??? simvastatin (ZOCOR) 20 mg tablet Take 10 mg by mouth every other day.     ??? FISH OIL 500 mg cap Take  by mouth.     ??? Cholecalciferol, Vitamin D3, (VITAMIN D3) 1,000 unit cap Take  by mouth.     ??? cinnamon bark (CINNAMON) 500 mg cap Take  by mouth.     ??? oxybutynin (DITROPAN) 5 mg tablet TAKE 1 TABLET BY MOUTH TWICE DAILY FOR BLADDER SPASMS OR URINARY FREQUENCY 180 Tab 1     No current facility-administered medications on file prior to visit.        Urinalysis:   Results for orders placed or performed in visit on 12/20/15   AMB POC URINALYSIS DIP STICK AUTO W/O MICRO   Result Value Ref Range    Color (UA POC) Yellow     Clarity (UA POC) Cloudy     Glucose (UA POC) Negative Negative     Bilirubin (UA POC) Negative Negative    Ketones (UA POC) Negative Negative    Specific gravity (UA POC) 1.030 1.001 - 1.035    Blood (UA POC) 3+ Negative    pH (UA POC) 6.0 4.6 - 8.0    Protein (UA POC) 1+ Negative mg/dL    Urobilinogen (UA POC) 0.2 mg/dL 0.2 - 1    Nitrites (UA POC) Negative Negative    Leukocyte esterase (UA POC) Trace Negative       Physical Exam:  Visit Vitals   ??? BP 116/72   ??? Ht 5' 2" (1.575 m)   ??? Wt 127 lb (57.6 kg)   ??? BMI 23.23 kg/m2     Constitutional: WDWN AF, appropriate affect, No acute distress.    CV:  RRR  Respiratory: No respiratory distress or difficulties  Abdomen:  No abdominal masses or tenderness.  No CVA tenderness.    GU:  Bladder is nonpalpable.   Skin: No jaundice.    Neuro/Psych:  Alert and oriented x 3. Affect appropriate.   Lymphatic:  No enlarged neck / subclavian lymph nodes.       Review of Systems  Constitutional: Fever: No  Skin: Rash: No  HEENT: Hearing difficulty: No  Eyes: Blurred vision: No  Cardiovascular: Chest pain: No  Respiratory: Shortness of breath: No  Gastrointestinal: Nausea/vomiting: No  Musculoskeletal: Back pain: No  Neurological: Weakness: No  Psychological: Memory loss: No  Comments/additional findings:     Assessment:   1. 5mm right distal ureteral stone --S/p right JJ stent placement 12/08/15   2. Microhematuria.   06/20/15 S/p Bladder Bx (Dr Brugh) secondary to 7 mm patch of erythematous bladder mucosa -- no malignancy    Plan:   1. Cysto, Right ureteroscopy with holmium laser lithotripsy and stent placement  2. Urine sent for culture today     For Ureteroscopy and laser lithotripsy:   I discussed the risks of Cystoscopy, Right retrograde pyelogram, ureteroscopy with holmium laser lithotripsy, and JJ ureteral stent placement, including bleeding, infection, injury to bladder, ureter, kidney, need for additional procedures, risk of anesthesia (CV-pulmonary   event, death)   Also discussed JJ ureteral stent symptoms,  including urinary frequency,  urgency, and dysuria, variable abdominal / flank pain mainly with urination, and variable hematuria.     All questions were answered. Patient verbalized understanding and desires to proceed with cystoscopy, retrograde pyelogram, ureteroscopy with holmium laser lithotripsy and JJ stent placement.       Documentation assistance provided by Sarah Hollowell, medical scribe for Daley Gosse O Shriyan Arakawa, MD       Tailor Westfall O Liona Wengert, MD      Cc:  Christian H Joyner, PA

## 2015-12-20 NOTE — H&P (View-Only) (Signed)
12/20/2015    CC: Kidney stone    Darlene Espinoza is a 60 y.o. Filipino female her for f/u of right distal ureteral stone 5mm   She is S/p Cysto, right JJ stent placement 12/08/15   FINDINGS: Right distal ureteral stone 5 mm    She reports some discomfort with the JJ stent in place but tolerating it fairly well.   She has been experiencing some urinary frequency and feeling of poking, otherwise no pain.     Pt previously seen by Dr. Esmeralda Arthur for microhematuria.   06/20/15 S/p Bladder Bx secondary to 7 mm patch of erythematous bladder mucosa seen on 05/07/15 Cysto for microhematuria  (See path below)    Family h/o stones, Brother  She reports family h/o gallstones (brother and sister)    12/08/15 CT Abd/pelvis:  IMPRESSION:   5 x 4 mm obstructing calculus in the distal right ureter just proximal to the UVJ resulting in moderate hydronephroureter.  Subcentimeter iso-to slightly hyperdense left upper pole renal lesion. ??Solid nodule versus complex cyst. Ultrasound may help further characterize.  Subcentimeter hepatic hypodensity, too small to further characterize.  Small hiatal hernia.  Large gallstone.  Incidental 4 mm right basilar pulmonary nodule. ??A chest CT can be obtained on a nonemergent basis.    Bladder Bx Path 06/20/15  A. ?? ??UROTHELIAL BIOPSY, CLINICALLY RIGHT BLADDER DOME: ??  ???? ?? ?? 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 ?? ?? ?? ??CHANGES.   ???? ?? ?? ??- NEGATIVE FOR DYSPLASIA AND MALIGNANCY.   B. ?? ??UROTHELIAL BIOPSY, CLINICALLY LEFT POSTERIOR BLADDER WALL: ??  ???? ?? ?? 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 ?? ?? ?? ??CHANGES.   ???? ?? ?? ??- NEGATIVE FOR DYSPLASIA AND MALIGNANCY.       AUA sx score: No flowsheet data found.     PMH/PSH/SocHx/FamHx/Meds & allergies reviewed.   Past Medical History:   Diagnosis Date   ??? Constipation    ??? Gallstones 01/29/2015   ??? H/O transurethral destruction of bladder lesion    ??? Hematuria    ??? Hemorrhoid    ??? Hypercholesterolemia     ??? Kidney stone    ??? Microscopic hematuria    ??? Nausea & vomiting      Past Surgical History:   Procedure Laterality Date   ??? HX COLONOSCOPY     ??? HX GYN      hysterectomy   ??? HX UROLOGICAL  06/20/2015    [facility].  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  Dr. Esmeralda Arthur.     ??? HX UROLOGICAL  12/08/2015    CYSTOURETHROSCOPY WITH INDWELLING URETERAL STENT INSERTION; RETROGRADE; Surgeon: Robbi Garter, MD     Social History     Social History   ??? Marital status: DIVORCED     Spouse name: N/A   ??? Number of children: N/A   ??? Years of education: N/A     Occupational History   ??? [employer]      Social History Main Topics   ??? Smoking status: Never Smoker   ??? Smokeless tobacco: Never Used   ??? Alcohol use No   ??? Drug use: No   ??? Sexual activity: Not on file     Other Topics Concern   ??? Not on file     Social History Narrative    ** Merged History Encounter **  Family History   Problem Relation Age of Onset   ??? Hypertension Mother    ??? Elevated Lipids Father    ??? Gout Father    ??? Diabetes Father    ??? Breast Cancer Sister      Allergies   Allergen Reactions   ??? Alcohol Rash     Current Outpatient Prescriptions on File Prior to Visit   Medication Sig Dispense Refill   ??? simvastatin (ZOCOR) 20 mg tablet Take 10 mg by mouth every other day.     ??? FISH OIL 500 mg cap Take  by mouth.     ??? Cholecalciferol, Vitamin D3, (VITAMIN D3) 1,000 unit cap Take  by mouth.     ??? cinnamon bark (CINNAMON) 500 mg cap Take  by mouth.     ??? oxybutynin (DITROPAN) 5 mg tablet TAKE 1 TABLET BY MOUTH TWICE DAILY FOR BLADDER SPASMS OR URINARY FREQUENCY 180 Tab 1     No current facility-administered medications on file prior to visit.        Urinalysis:   Results for orders placed or performed in visit on 12/20/15   AMB POC URINALYSIS DIP STICK AUTO W/O MICRO   Result Value Ref Range    Color (UA POC) Yellow     Clarity (UA POC) Cloudy     Glucose (UA POC) Negative Negative     Bilirubin (UA POC) Negative Negative    Ketones (UA POC) Negative Negative    Specific gravity (UA POC) 1.030 1.001 - 1.035    Blood (UA POC) 3+ Negative    pH (UA POC) 6.0 4.6 - 8.0    Protein (UA POC) 1+ Negative mg/dL    Urobilinogen (UA POC) 0.2 mg/dL 0.2 - 1    Nitrites (UA POC) Negative Negative    Leukocyte esterase (UA POC) Trace Negative       Physical Exam:  Visit Vitals   ??? BP 116/72   ??? Ht 5\' 2"  (1.575 m)   ??? Wt 127 lb (57.6 kg)   ??? BMI 23.23 kg/m2     Constitutional: WDWN AF, appropriate affect, No acute distress.    CV:  RRR  Respiratory: No respiratory distress or difficulties  Abdomen:  No abdominal masses or tenderness.  No CVA tenderness.    GU:  Bladder is nonpalpable.   Skin: No jaundice.    Neuro/Psych:  Alert and oriented x 3. Affect appropriate.   Lymphatic:  No enlarged neck / subclavian lymph nodes.       Review of Systems  Constitutional: Fever: No  Skin: Rash: No  HEENT: Hearing difficulty: No  Eyes: Blurred vision: No  Cardiovascular: Chest pain: No  Respiratory: Shortness of breath: No  Gastrointestinal: Nausea/vomiting: No  Musculoskeletal: Back pain: No  Neurological: Weakness: No  Psychological: Memory loss: No  Comments/additional findings:     Assessment:   1. 5mm right distal ureteral stone --S/p right JJ stent placement 12/08/15   2. Microhematuria.   06/20/15 S/p Bladder Bx (Dr Esmeralda Arthur) secondary to 7 mm patch of erythematous bladder mucosa -- no malignancy    Plan:   1. Cysto, Right ureteroscopy with holmium laser lithotripsy and stent placement  2. Urine sent for culture today     For Ureteroscopy and laser lithotripsy:   I discussed the risks of Cystoscopy, Right retrograde pyelogram, ureteroscopy with holmium laser lithotripsy, and JJ ureteral stent placement, including bleeding, infection, injury to bladder, ureter, kidney, need for additional procedures, risk of anesthesia (CV-pulmonary  event, death)   Also discussed JJ ureteral stent symptoms,  including urinary frequency,  urgency, and dysuria, variable abdominal / flank pain mainly with urination, and variable hematuria.     All questions were answered. Patient verbalized understanding and desires to proceed with cystoscopy, retrograde pyelogram, ureteroscopy with holmium laser lithotripsy and JJ stent placement.       Documentation assistance provided by Cheyenne Adas, medical scribe for Etheleen Nicks, MD       Etheleen Nicks, MD      Cc:  Rosilyn Mings, [place]

## 2015-12-22 LAB — URINE C&S

## 2015-12-25 NOTE — Telephone Encounter (Signed)
Called and left a message with the patient in regards to scheduling her surgery with Dr. Louretta ParmaKwong. I offered her Monday 01/13/16 at our surgery center and asked that she call me back to tell me yes or no. I left her my direct phone number.

## 2015-12-25 NOTE — Telephone Encounter (Signed)
Patient returned my call in regards to setting up her surgery with Dr. Louretta ParmaKwong. She said that Monday 01/13/16 would work for her. I told her that her surgery would be at 9:30am and she would need to check in to our surgery center at 7:30am. She confirmed. I also told her that I would need her to do testing on 01/03/16 at [facility] and she said that she would go to [facility] to have that done. I told her that I would mail her all of the surgery information that she would need tomorrow. She said that would be great and thanked me.    She said that she changed her last name with her HR department yesterday, but was not sure when it would change with insurance. She said that she has changed it to Unadilla Forksarlos, but would call me when she knows it changed with insurance so we can change it in our system. I said that was fine and thanked her.

## 2015-12-26 NOTE — Telephone Encounter (Signed)
Patient called in stating that her last name was changed to Speculatorarlos with her insurance. I told her I would change it in our system. I told her I would change it under her chart and her insurance. She stated she also had [employer] now too. I confirmed & put it in her chart. Patient will go to [facility]. I told her it would be on her orders.

## 2015-12-31 ENCOUNTER — Encounter

## 2016-01-06 ENCOUNTER — Inpatient Hospital Stay: Admit: 2016-01-06 | Primary: Physician Assistant

## 2016-01-06 ENCOUNTER — Ambulatory Visit: Payer: PRIVATE HEALTH INSURANCE | Primary: Physician Assistant

## 2016-01-06 DIAGNOSIS — N201 Calculus of ureter: Secondary | ICD-10-CM

## 2016-01-06 LAB — SENTARA SPECIMEN COLLN.

## 2016-01-07 LAB — METABOLIC PANEL, BASIC
Anion gap: 16 mmol/L
BUN: 13 mg/dL (ref 6–22)
CO2: 25 mmol/L (ref 20–32)
Calcium: 8.8 mg/dL (ref 8.4–10.5)
Chloride: 102 mmol/L (ref 98–110)
Creatinine: 0.5 mg/dL — ABNORMAL LOW (ref 0.8–1.4)
GFRAA: >60 (ref >60.0)
GFRNA: >60 (ref >60.0)
Glucose: 96 mg/dL (ref 70–99)
Potassium: 4.1 mmol/L (ref 3.5–5.5)
Sodium: 143 mmol/L (ref 133–145)

## 2016-01-07 LAB — CBC WITH AUTOMATED DIFF
ABS. BASOPHILS: 0 10*3/uL (ref 0.0–0.2)
ABS. EOSINOPHILS: 0.1 10*3/uL (ref 0.0–0.5)
ABS. MONOCYTES: 0.3 10*3/uL (ref 0.1–0.9)
ABS. NEUTROPHILS: 2.5 10*3/uL (ref 1.8–7.7)
ABSOLUTE LYMPHOCYTE COUNT: 1.6 10*3/uL (ref 1.0–4.8)
BASOPHILS: 1 % (ref 0–2)
EOSINOPHILS: 2 % (ref 0–6)
HCT: 38.5 % (ref 35.1–48.0)
HGB: 12.3 g/dL (ref 11.7–16.0)
Lymphocytes: 36 % (ref 27–45)
MCH: 30 pg (ref 26–34)
MCHC: 32 g/dL (ref 32–36)
MCV: 94 fL (ref 80–95)
MONOCYTES: 7 % (ref 3–9)
MPV: 10.8 fL (ref 6.0–10.8)
NEUTROPHILS: 55 % (ref 40–75)
PLATELET: 234 10*3/uL (ref 140–440)
RBC: 4.11 M/uL (ref 3.80–5.20)
RDW: 13.5 % (ref 10.0–16.0)
WBC: 4.4 10*3/uL (ref 4.0–11.0)

## 2016-01-08 LAB — CULTURE, URINE: CULTURE RESULT: NORMAL

## 2016-01-08 NOTE — Telephone Encounter (Signed)
Patient is experiencing Gross Hematuria. I spoke with Joni ReiningNicole who stated that it is normal to pass some blood- Signs of emergent is excessive amounts of dark red blood and clotting- Watch for fever/nausea/chills/ Patient is not experiencing any of those symptoms at this time. Patient was advised to push a lot of fluids. Patient thank'd me and call ended.    Thank you,  S

## 2016-01-13 ENCOUNTER — Encounter

## 2016-01-13 ENCOUNTER — Inpatient Hospital Stay: Payer: PRIVATE HEALTH INSURANCE

## 2016-01-13 ENCOUNTER — Inpatient Hospital Stay: Admit: 2016-01-13 | Payer: PRIVATE HEALTH INSURANCE | Attending: Urology | Primary: Physician Assistant

## 2016-01-13 DIAGNOSIS — N200 Calculus of kidney: Secondary | ICD-10-CM

## 2016-01-13 MED ORDER — NALOXONE 0.4 MG/ML INJECTION
0.4 mg/mL | INTRAMUSCULAR | Status: DC | PRN
Start: 2016-01-13 — End: 2016-01-13

## 2016-01-13 MED ORDER — ACETAMINOPHEN 325 MG TABLET
325 mg | Freq: Once | ORAL | Status: DC | PRN
Start: 2016-01-13 — End: 2016-01-13

## 2016-01-13 MED ORDER — MEPERIDINE (PF) 25 MG/ML INJ SOLUTION
25 mg/ml | INTRAMUSCULAR | Status: DC | PRN
Start: 2016-01-13 — End: 2016-01-13

## 2016-01-13 MED ORDER — FENTANYL CITRATE (PF) 50 MCG/ML IJ SOLN
50 mcg/mL | INTRAMUSCULAR | Status: DC | PRN
Start: 2016-01-13 — End: 2016-01-13
  Administered 2016-01-13 (×2): via INTRAVENOUS

## 2016-01-13 MED ORDER — FENTANYL CITRATE (PF) 50 MCG/ML IJ SOLN
50 mcg/mL | INTRAMUSCULAR | Status: DC
Start: 2016-01-13 — End: 2016-01-13

## 2016-01-13 MED ORDER — DEXAMETHASONE SODIUM PHOSPHATE 4 MG/ML IJ SOLN
4 mg/mL | Freq: Once | INTRAMUSCULAR | Status: DC | PRN
Start: 2016-01-13 — End: 2016-01-13

## 2016-01-13 MED ORDER — LACTATED RINGERS IV
INTRAVENOUS | Status: DC
Start: 2016-01-13 — End: 2016-01-13
  Administered 2016-01-13: 12:00:00 via INTRAVENOUS

## 2016-01-13 MED ORDER — OXYCODONE-ACETAMINOPHEN 5 MG-325 MG TAB
5-325 mg | ORAL | Status: DC | PRN
Start: 2016-01-13 — End: 2016-01-13

## 2016-01-13 MED ORDER — DIPHENHYDRAMINE HCL 50 MG/ML IJ SOLN
50 mg/mL | INTRAMUSCULAR | Status: DC | PRN
Start: 2016-01-13 — End: 2016-01-13

## 2016-01-13 MED ORDER — HYDRALAZINE 20 MG/ML IJ SOLN
20 mg/mL | INTRAMUSCULAR | Status: DC | PRN
Start: 2016-01-13 — End: 2016-01-13

## 2016-01-13 MED ORDER — EPHEDRINE SULFATE 50 MG/ML IJ SOLN
50 mg/mL | INTRAMUSCULAR | Status: DC | PRN
Start: 2016-01-13 — End: 2016-01-13
  Administered 2016-01-13 (×3): via INTRAVENOUS

## 2016-01-13 MED ORDER — LACTATED RINGERS IV
INTRAVENOUS | Status: DC | PRN
Start: 2016-01-13 — End: 2016-01-13
  Administered 2016-01-13: 14:00:00 via INTRAVENOUS

## 2016-01-13 MED ORDER — CEFAZOLIN 2 GM/50 ML IN DEXTROSE (ISO-OSMOTIC) IVPB
2 gram/50 mL | INTRAVENOUS | Status: AC
Start: 2016-01-13 — End: 2016-01-13
  Administered 2016-01-13: 14:00:00 via INTRAVENOUS

## 2016-01-13 MED ORDER — FLUMAZENIL 0.1 MG/ML IV SOLN
0.1 mg/mL | INTRAVENOUS | Status: DC | PRN
Start: 2016-01-13 — End: 2016-01-13

## 2016-01-13 MED ORDER — HALOPERIDOL LACTATE 5 MG/ML IJ SOLN
5 mg/mL | INTRAMUSCULAR | Status: DC | PRN
Start: 2016-01-13 — End: 2016-01-13

## 2016-01-13 MED ORDER — CEPHALEXIN 500 MG CAP
500 mg | ORAL_CAPSULE | Freq: Three times a day (TID) | ORAL | 0 refills | Status: AC
Start: 2016-01-13 — End: 2016-01-15

## 2016-01-13 MED ORDER — ONDANSETRON (PF) 4 MG/2 ML INJECTION
4 mg/2 mL | INTRAMUSCULAR | Status: DC | PRN
Start: 2016-01-13 — End: 2016-01-13
  Administered 2016-01-13: 14:00:00 via INTRAVENOUS

## 2016-01-13 MED ORDER — ALBUTEROL SULFATE 2.5 MG/0.5 ML NEB SOLUTION
2.5 mg/0.5 mL | Freq: Once | RESPIRATORY_TRACT | Status: DC | PRN
Start: 2016-01-13 — End: 2016-01-13

## 2016-01-13 MED ORDER — PROPOFOL 10 MG/ML IV EMUL
10 mg/mL | INTRAVENOUS | Status: DC | PRN
Start: 2016-01-13 — End: 2016-01-13
  Administered 2016-01-13: 14:00:00 via INTRAVENOUS

## 2016-01-13 MED ORDER — LABETALOL 5 MG/ML IV SYRINGE
20 mg/4 mL (5 mg/mL) | INTRAVENOUS | Status: DC | PRN
Start: 2016-01-13 — End: 2016-01-13

## 2016-01-13 MED ORDER — HYDROCODONE-ACETAMINOPHEN 5 MG-325 MG TAB
5-325 mg | ORAL_TABLET | Freq: Four times a day (QID) | ORAL | 0 refills | Status: AC | PRN
Start: 2016-01-13 — End: ?

## 2016-01-13 MED ORDER — SCOPOLAMINE (1.3-1.5) MG 72 HR TRANSDERM PATCH
1 mg over 3 days | TRANSDERMAL | Status: AC
Start: 2016-01-13 — End: 2016-01-13

## 2016-01-13 MED ORDER — FENTANYL CITRATE (PF) 50 MCG/ML IJ SOLN
50 mcg/mL | INTRAMUSCULAR | Status: DC | PRN
Start: 2016-01-13 — End: 2016-01-13

## 2016-01-13 MED ORDER — LIDOCAINE (PF) 20 MG/ML (2 %) IJ SOLN
20 mg/mL (2 %) | INTRAMUSCULAR | Status: DC | PRN
Start: 2016-01-13 — End: 2016-01-13
  Administered 2016-01-13: 14:00:00 via INTRAVENOUS

## 2016-01-13 MED ORDER — ONDANSETRON (PF) 4 MG/2 ML INJECTION
4 mg/2 mL | Freq: Once | INTRAMUSCULAR | Status: DC | PRN
Start: 2016-01-13 — End: 2016-01-13

## 2016-01-13 MED ORDER — HYDROMORPHONE (PF) 1 MG/ML IJ SOLN
1 mg/mL | INTRAMUSCULAR | Status: DC | PRN
Start: 2016-01-13 — End: 2016-01-13

## 2016-01-13 MED ORDER — IOTHALAMATE MEGLUMINE 60 % INJECTION
60 % | INTRAMUSCULAR | Status: DC | PRN
Start: 2016-01-13 — End: 2016-01-13
  Administered 2016-01-13: 15:00:00

## 2016-01-13 MED ORDER — INSULIN REGULAR HUMAN 100 UNIT/ML INJECTION
100 unit/mL | Freq: Once | INTRAMUSCULAR | Status: DC | PRN
Start: 2016-01-13 — End: 2016-01-13

## 2016-01-13 MED FILL — ALBUTEROL SULFATE 2.5 MG/0.5 ML NEB SOLUTION: 2.5 mg/0.5 mL | RESPIRATORY_TRACT | Qty: 0.5

## 2016-01-13 MED FILL — FENTANYL CITRATE (PF) 50 MCG/ML IJ SOLN: 50 mcg/mL | INTRAMUSCULAR | Qty: 2

## 2016-01-13 MED FILL — CEFAZOLIN 2 GM/50 ML IN DEXTROSE (ISO-OSMOTIC) IVPB: 2 gram/50 mL | INTRAVENOUS | Qty: 50

## 2016-01-13 MED FILL — TRANSDERM-SCOP 1 MG OVER 3 DAYS TRANSDERMAL PATCH: 1 mg over 3 days | TRANSDERMAL | Qty: 1

## 2016-01-13 MED FILL — HYDROMORPHONE (PF) 1 MG/ML IJ SOLN: 1 mg/mL | INTRAMUSCULAR | Qty: 1

## 2016-01-13 MED FILL — LABETALOL 5 MG/ML IV SYRINGE: 20 mg/4 mL (5 mg/mL) | INTRAVENOUS | Qty: 4

## 2016-01-13 MED FILL — INSULIN REGULAR HUMAN 100 UNIT/ML INJECTION: 100 unit/mL | INTRAMUSCULAR | Qty: 1

## 2016-01-13 MED FILL — LACTATED RINGERS IV: INTRAVENOUS | Qty: 1000

## 2016-01-13 NOTE — Op Note (Signed)
OPERATION    LOCATION OF PATIENT:  Room: [facility]    DATE OF OPERATION:  01/13/2016    SURGEON:  Robbi Garter, MD    PREOPERATIVE DIAGNOSIS:  Right distal ureteral stone 5mm --S/p right JJ stent placement 12/08/15    POSTOPERATIVE DIAGNOSIS:  Right distal ureteral stone 5mm --S/p right JJ stent placement 12/08/15    OPERATION PERFORMED:  1. Cystoscopy  2. Right retrograde pyelogram  3. Right ureteroscopy, laser lithotripsy, stone extraction  4. Right ureteral stent exchange.  5. < 1 hour physician fluoroscopy imaging interpretation time    ANESTHESIA:  General.    ESTIMATED BLOOD LOSS:  Minimal.    COMPLICATIONS:  None.    INDICATIONS FOR PROCEDURE:  Darlene Espinoza is a 60 y.o. Filipino female, who recently had a Right distal ureteral stone 5mm found on CT scan. She had a right JJ stent placement 12/08/15. She is now brought back today for treatment of the Right distal ureteral stone with cystoscopy, right ureteroscopy, laser lithotripsy, and stent exchange. The patient understands risks, benefits, detailed procedure, and possible injury to the urethra, bladder, ureter, and kidney. Also possible need for additional surgery.    FINDINGS:  Right distal ureteral stone 5mm     SPECIMENS:  Right ureteral stone fragment sent for C&S chemical analysis.    DRAINS:  A 6-French, 24-cm double pigtail stent Vern Claude) with long tether string.      DESCRIPTION OF OPERATION:  After informed consent was obtained from the patient, the patient was identified and the patient was taken to the operating room on 01/13/2016, and placed in the supine position.  General anesthesia was administered as well as perioperative IV antibiotics.  At the beginning of the case, a time-out was performed to properly identify the patient, surgery to be performed, and surgical site.  Sequential compression devices were applied to the lower extremities at the beginning of the case for DVT prophylaxis.  The patient was  then placed in the dorsal lithotomy supine position, prepped and draped in sterile fashion.  Preliminary scout fluoroscopy revealed that there was a 5 mm calcification area at the right distal ureter alongside the pre-existing right ureteral stent, which was in good position.  We then passed the 21-French rigid cystoscope through the urethra and into the bladder under vision without any difficulty.  A systematic evaluation of the bladder revealed no evidence of any suspicious bladder lesions.  Ureteral orifices were in normal position.    The distal aspect of the right ureteral stent was seen protruding from the right ureteral orifice.  We then used the alligator-tooth forceps and grasped the distal end of the ureteral stent and brought it out the urethral meatus while watching the proximal coil straighten out nicely.  Through the ureteral stent, we then passed a 0.038 Glidewire up to the level of the renal pelvis.  The ureteral stent was then removed, leaving the Glidewire up the right ureter.      The semi-rigid ureteroscope was then passed into the bladder under direct vision and then up the the right ureter alongside the pre-existing Glidewire.    In the distal ureter, we encountered 5mm stone. Using the 272 micron holmium laser fiber, the stone fragmented completely.   After the stone was fragmented, I then used the 2.2-French tipless Nitinol basket to extract stone fragments from the distal ureter and these were passed off field to be sent off for chemical analysis.   We then withdrew  the ureteroscope back down the ureter after noting no evidence of any significant residual stone fragments in the ureter.  Prior to removing the ureteroscope, we did perform a gentle retrograde pyelogram, showing no contrast extravasation and no hydronephrosis.  Once the ureteroscope was removed, we then used the Glidewire under fluoroscopic guidance and passed up a [employer] 6-French, 24 cm double-pigtail ureteral stent up  ureter, making sure that the proximal and distal ends coil within the kidney and bladder respectfully.    Note that we left a long tether string attached to the distal end of the ureteral stent and it exited the urethral meatus and was secured to the inner thigh with a tegaderm adhesive.  The cystoscope was then advanced back into the bladder under vision.  We were able to see the distal stent coiling nicely within the bladder.  We then emptied the bladder of irrigation solution.  The cystoscope was then removed.      The patient tolerated the procedure well and there was no complication.   Patient was awoken from anesthesia and taken to the recovery room in stable condition. She will be discharged home.  Follow up with me in 7 to 10 days for stent with string removal in the office.

## 2016-01-13 NOTE — Interval H&P Note (Signed)
H&P Update:  Jeanann LewandowskyLynn Ilyas was seen and examined.  History and physical has been reviewed. The patient has been examined. There have been no significant clinical changes since the completion of the originally dated History and Physical.    Signed By: Etheleen NicksPeter O Atlas Crossland, MD     January 13, 2016 9:45 AM

## 2016-01-13 NOTE — Op Note (Signed)
OPERATION    LOCATION OF PATIENT:  [facility]    DATE OF OPERATION:  01/13/2016    SURGEON:  Robbi GarterPeter Kevontae Burgoon, MD    PREOPERATIVE DIAGNOSIS:  Right distal ureteral stone 5mm --S/p right JJ stent placement 12/08/15    POSTOPERATIVE DIAGNOSIS:  Right distal ureteral stone 5mm --S/p right JJ stent placement 12/08/15    OPERATION PERFORMED:  1. Cystoscopy  2. Right retrograde pyelogram  3. Right ureteroscopy, laser lithotripsy, stone extraction  4. Right ureteral stent exchange.  5. < 1 hour physician fluoroscopy imaging interpretation time    ANESTHESIA:  General.    ESTIMATED BLOOD LOSS:  Minimal.    COMPLICATIONS:  None.    INDICATIONS FOR PROCEDURE:  Jeanann LewandowskyLynn Vita is a 60 y.o. Filipino female, who recently had a Right distal ureteral stone 5mm found on CT scan. She had a right JJ stent placement 12/08/15. She is now brought back today for treatment of the Right distal ureteral stone with cystoscopy, right ureteroscopy, laser lithotripsy, and stent exchange. The patient understands risks, benefits, detailed procedure, and possible injury to the urethra, bladder, ureter, and kidney. Also possible need for additional surgery.    FINDINGS:  Right distal ureteral stone 5mm     SPECIMENS:  Right ureteral stone fragment sent for C&S chemical analysis.    DRAINS:  A 6-French, 24-cm double pigtail stent Vern Claude([employer]) with long tether string.      DESCRIPTION OF OPERATION:  After informed consent was obtained from the patient, the patient was identified and the patient was taken to the operating room on 01/13/2016, and placed in the supine position.  General anesthesia was administered as well as perioperative IV antibiotics.  At the beginning of the case, a time-out was performed to properly identify the patient, surgery to be performed, and surgical site.  Sequential compression devices were applied to the lower extremities at the beginning of the case for DVT prophylaxis.   The patient was then placed in the dorsal lithotomy supine position, prepped and draped in sterile fashion.  Preliminary scout fluoroscopy revealed that there was a 5 mm calcification area at the right distal ureter alongside the pre-existing right ureteral stent, which was in good position.  We then passed the 21-French rigid cystoscope through the urethra and into the bladder under vision without any difficulty.  A systematic evaluation of the bladder revealed no evidence of any suspicious bladder lesions.  Ureteral orifices were in normal position.    The distal aspect of the right ureteral stent was seen protruding from the right ureteral orifice.  We then used the alligator-tooth forceps and grasped the distal end of the ureteral stent and brought it out the urethral meatus while watching the proximal coil straighten out nicely.  Through the ureteral stent, we then passed a 0.038 Glidewire up to the level of the renal pelvis.  The ureteral stent was then removed, leaving the Glidewire up the right ureter.      The semi-rigid ureteroscope was then passed into the bladder under direct vision and then up the the right ureter alongside the pre-existing Glidewire.    In the distal ureter, we encountered 5mm stone. Using the 272 micron holmium laser fiber, the stone fragmented completely.   After the stone was fragmented, I then used the 2.2-French tipless Nitinol basket to extract stone fragments from the distal ureter and these were passed off field to be sent off for chemical analysis.   We then withdrew  the ureteroscope back down the ureter after noting no evidence of any significant residual stone fragments in the ureter.  Prior to removing the ureteroscope, we did perform a gentle retrograde pyelogram, showing no contrast extravasation and no hydronephrosis.  Once the ureteroscope was removed, we then used the Glidewire under fluoroscopic guidance and passed up a Med[employer]onic 6-French, 24 cm  double-pigtail ureteral stent up ureter, making sure that the proximal and distal ends coil within the kidney and bladder respectfully.    Note that we left a long tether string attached to the distal end of the ureteral stent and it exited the urethral meatus and was secured to the inner thigh with a tegaderm adhesive.  The cystoscope was then advanced back into the bladder under vision.  We were able to see the distal stent coiling nicely within the bladder.  We then emptied the bladder of irrigation solution.  The cystoscope was then removed.      The patient tolerated the procedure well and there was no complication.   Patient was awoken from anesthesia and taken to the recovery room in stable condition. She will be discharged home.  Follow up with me in 7 to 10 days for stent with string removal in the office.

## 2016-01-13 NOTE — Anesthesia Post-Procedure Evaluation (Signed)
Post-Anesthesia Evaluation and Assessment    Patient: Darlene Espinoza  SSN: [id number]    Date of Birth: [birth year]  Age: 60 y.o.  Sex: female       Cardiovascular Function/Vital Signs  Visit Vitals   ??? BP 108/66   ??? Pulse 68   ??? Temp 36.9 ??C (98.5 ??F)   ??? Resp 11   ??? Ht 5\' 3"  (1.6 m)   ??? Wt 57.2 kg (126 lb 3.2 oz)   ??? SpO2 100%   ??? BMI 22.36 kg/m2       Patient is status post general anesthesia for Procedure(s):  CYSTOURETHROSCOPY RIGHT  URETEROSCOPY WITH LASER LITHOTRIPSY LEFT URETERAL STENT EXCHANGE, RIGHT RETROGRADE PYELOGRAM.    Nausea/Vomiting: None    Postoperative hydration reviewed and adequate.    Pain:  Pain Scale 1: Visual (01/13/16 1051)  Pain Intensity 1: 0 (01/13/16 1051)   Managed    Neurological Status:   Neuro (WDL): Exceptions to WDL (sedated) (01/13/16 1051)   At baseline    Mental Status and Level of Consciousness: Arousable    Pulmonary Status:   O2 Device: Oxygen mask (01/13/16 1051)   Adequate oxygenation and airway patent    Complications related to anesthesia: None    Post-anesthesia assessment completed. No concerns    Signed By: Di Kindlehristopher S Dupre???-Rios, MD     January 13, 2016

## 2016-01-13 NOTE — Anesthesia Pre-Procedure Evaluation (Signed)
Anesthetic History     PONV          Review of Systems / Medical History  Patient summary reviewed, nursing notes reviewed and pertinent labs reviewed    Pulmonary  Within defined limits                 Neuro/Psych   Within defined limits           Cardiovascular                  Exercise tolerance: >4 METS     GI/Hepatic/Renal  Within defined limits              Endo/Other  Within defined limits           Other Findings              Physical Exam    Airway  Mallampati: II  TM Distance: 4 - 6 cm    Mouth opening: Normal     Cardiovascular    Rhythm: regular  Rate: normal         Dental  No notable dental hx       Pulmonary  Breath sounds clear to auscultation               Abdominal         Other Findings            Anesthetic Plan    ASA: 2  Anesthesia type: general          Induction: Intravenous  Anesthetic plan and risks discussed with: Patient

## 2016-01-14 NOTE — Telephone Encounter (Signed)
postop f/u   Received: Yesterday ??   ?? Etheleen NicksPeter O Kwong, MD  Glory Buffiane Loui Massenburg   ??    ??    ??   ?? Follow up 7-10 days for stent with string removal (unless pt able to remove it herself). ??Then f/u w/ me in 4-6 weeks.     Dx: Right ureteral stone, s/p Right ureteroscopy w/ laser litho 01/13/16    ??   ??     Spoke with patient and scheduled appt

## 2016-01-16 NOTE — Telephone Encounter (Signed)
Sorry routed incorrect person please disregard

## 2016-01-16 NOTE — Telephone Encounter (Signed)
Patient called surgery done on 10/16 wants to talk to the Doctor or nurse please call her back [phone]; some stinging/burning.

## 2016-01-18 MED FILL — EPHEDRINE SULFATE 50 MG/ML IJ SOLN: 50 mg/mL | INTRAMUSCULAR | Qty: 17.5

## 2016-01-18 MED FILL — XYLOCAINE-MPF 20 MG/ML (2 %) INJECTION SOLUTION: 20 mg/mL (2 %) | INTRAMUSCULAR | Qty: 50

## 2016-01-18 MED FILL — PROPOFOL 10 MG/ML IV EMUL: 10 mg/mL | INTRAVENOUS | Qty: 140

## 2016-01-18 MED FILL — ONDANSETRON (PF) 4 MG/2 ML INJECTION: 4 mg/2 mL | INTRAMUSCULAR | Qty: 4

## 2016-01-20 ENCOUNTER — Institutional Professional Consult (permissible substitution): Admit: 2016-01-20 | Discharge: 2016-01-20 | Payer: PRIVATE HEALTH INSURANCE | Primary: Physician Assistant

## 2016-01-20 ENCOUNTER — Encounter: Attending: Urology | Primary: Physician Assistant

## 2016-01-20 NOTE — Progress Notes (Signed)
A user error has taken place: encounter opened in error, closed for administrative reasons.

## 2016-02-11 ENCOUNTER — Encounter: Attending: Urology | Primary: Physician Assistant

## 2016-02-26 ENCOUNTER — Ambulatory Visit
Admit: 2016-02-26 | Discharge: 2016-02-26 | Payer: PRIVATE HEALTH INSURANCE | Attending: Urology | Primary: Physician Assistant

## 2016-02-26 DIAGNOSIS — N201 Calculus of ureter: Secondary | ICD-10-CM

## 2016-02-26 LAB — AMB POC URINALYSIS DIP STICK AUTO W/O MICRO
Bilirubin (UA POC): NEGATIVE
Glucose (UA POC): NEGATIVE
Ketones (UA POC): NEGATIVE
Nitrites (UA POC): NEGATIVE
Protein (UA POC): NEGATIVE
Specific gravity (UA POC): 1.005 (ref 1.001–1.035)
Urobilinogen (UA POC): 0.2 (ref 0.2–1)
pH (UA POC): 5 (ref 4.6–8.0)

## 2016-02-26 NOTE — Progress Notes (Signed)
02/26/2016    CC: Kidney stone    Darlene LewandowskyLynn Espinoza is a 60 y.o. Filipino female her for f/u of right distal ureteral stone 5mm   She is S/p Cysto, right JJ stent placement 12/08/15   FINDINGS: Right distal ureteral stone 5 mm  --S/p Right ureteroscopy w/ laser litho 01/13/16   FINDINGS: Right distal ureteral stone 5mm     >>She reports doing well overall since stent removal.   No bothersome voiding issues since surgery/stent removal.   No abd or flank pain. No f/c/n/v.     Pt previously seen by Dr. Esmeralda ArthurBrugh for microhematuria.   06/20/15 S/p Bladder Bx secondary to 7 mm patch of erythematous bladder mucosa seen on 05/07/15 Cysto for microhematuria  (See path below)    Family h/o stones, Brother  She reports family h/o gallstones (brother and sister)    12/08/15 CT Abd/pelvis:  IMPRESSION:   5 x 4 mm obstructing calculus in the distal right ureter just proximal to the UVJ resulting in moderate hydronephroureter.  Subcentimeter iso-to slightly hyperdense left upper pole renal lesion. ??Solid nodule versus complex cyst. Ultrasound may help further characterize.  Subcentimeter hepatic hypodensity, too small to further characterize.  Small hiatal hernia.  Large gallstone, 2cm.  Incidental 4 mm right basilar pulmonary nodule. ??A chest CT can be obtained on a nonemergent basis.    Bladder Bx Path 06/20/15  A. ?? ??UROTHELIAL BIOPSY, CLINICALLY RIGHT BLADDER DOME: ??  ???? ?? ?? ??- MILD CHRONIC NONSPECIFIC CYSTITIS WITH REACTIVE UROTHELIAL CHANGES.   ???? ?? ?? ??- NEGATIVE FOR DYSPLASIA AND MALIGNANCY.   B. ?? ??UROTHELIAL BIOPSY, CLINICALLY LEFT POSTERIOR BLADDER WALL: ??  ???? ?? ?? 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 ?? ?? ?? ??CHANGES.   ???? ?? ?? ??- NEGATIVE FOR DYSPLASIA AND MALIGNANCY.     AUA sx score: No flowsheet data found.     PMH/PSH/SocHx/FamHx/Meds & allergies reviewed.   Past Medical History:   Diagnosis Date   ??? Constipation    ??? Gallstones 01/29/2015   ??? Hematuria    ??? Hemorrhoid    ??? Hypercholesterolemia     ??? Kidney stone    ??? Microscopic hematuria    ??? PONV (postoperative nausea and vomiting)      Past Surgical History:   Procedure Laterality Date   ??? HX COLONOSCOPY     ??? HX HYSTERECTOMY     ??? HX UROLOGICAL  06/20/2015    CHG.  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  Dr. Esmeralda ArthurBrugh.     ??? HX UROLOGICAL  12/08/2015    CYSTOURETHROSCOPY WITH INDWELLING URETERAL STENT INSERTION; RETROGRADE; Surgeon: Robbi GarterKwong, Jarek Longton, MD     Social History     Social History   ??? Marital status: MARRIED     Spouse name: N/A   ??? Number of children: N/A   ??? Years of education: N/A     Occupational History   ??? [employer]      Social History Main Topics   ??? Smoking status: Never Smoker   ??? Smokeless tobacco: Never Used   ??? Alcohol use No   ??? Drug use: No   ??? Sexual activity: Not on file     Other Topics Concern   ??? Not on file     Social History Narrative    ** Merged History Encounter **          Family History   Problem  Relation Age of Onset   ??? Hypertension Mother    ??? Elevated Lipids Father    ??? Gout Father    ??? Diabetes Father    ??? Breast Cancer Sister      Allergies   Allergen Reactions   ??? Alcohol Rash     Current Outpatient Prescriptions on File Prior to Visit   Medication Sig Dispense Refill   ??? simvastatin (ZOCOR) 20 mg tablet Take 10 mg by mouth every other day.     ??? FISH OIL 500 mg cap Take  by mouth.     ??? Cholecalciferol, Vitamin D3, (VITAMIN D3) 1,000 unit cap Take  by mouth.     ??? cinnamon bark (CINNAMON) 500 mg cap Take  by mouth.     ??? HYDROcodone-acetaminophen (NORCO) 5-325 mg per tablet Take 1 Tab by mouth every six (6) hours as needed for Pain. Max Daily Amount: 4 Tabs. 12 Tab 0   ??? oxybutynin (DITROPAN) 5 mg tablet TAKE 1 TABLET BY MOUTH TWICE DAILY FOR BLADDER SPASMS OR URINARY FREQUENCY 180 Tab 1     No current facility-administered medications on file prior to visit.        Urinalysis:   Results for orders placed or performed in visit on 02/26/16    AMB POC URINALYSIS DIP STICK AUTO W/O MICRO   Result Value Ref Range    Color (UA POC) Yellow     Clarity (UA POC) Cloudy     Glucose (UA POC) Negative Negative    Bilirubin (UA POC) Negative Negative    Ketones (UA POC) Negative Negative    Specific gravity (UA POC) 1.005 1.001 - 1.035    Blood (UA POC) Trace Negative    pH (UA POC) 5.0 4.6 - 8.0    Protein (UA POC) Negative Negative    Urobilinogen (UA POC) 0.2 mg/dL 0.2 - 1    Nitrites (UA POC) Negative Negative    Leukocyte esterase (UA POC) Trace Negative       Physical Exam:  Visit Vitals   ??? BP 116/72   ??? Ht 5\' 3"  (1.6 m)   ??? Wt 126 lb (57.2 kg)   ??? BMI 22.32 kg/m2     Constitutional: WDWN AF, appropriate affect, No acute distress.    CV:  RRR  Respiratory: No respiratory distress or difficulties  Abdomen:  No abdominal masses or tenderness.  No CVA tenderness.    GU:  Bladder is nonpalpable.   Skin: No jaundice.    Neuro/Psych:  Alert and oriented x 3. Affect appropriate.   Lymphatic:  No enlarged neck / subclavian lymph nodes.       Review of Systems  Constitutional: Fever: No  Skin: Rash: No  HEENT: Hearing difficulty: No  Eyes: Blurred vision: No  Cardiovascular: Chest pain: No  Respiratory: Shortness of breath: No  Gastrointestinal: Nausea/vomiting: No  Musculoskeletal: Back pain: No  Neurological: Weakness: No  Psychological: Memory loss: No  Comments/additional findings:     Assessment:   1. 5mm right distal ureteral stone   --S/p right JJ stent placement 12/08/15   --S/p Right ureteroscopy w/ laser litho 01/13/16   2. Microhematuria.   06/20/15 S/p Bladder Bx (Dr Esmeralda Arthur) secondary to 7 mm patch of erythematous bladder mucosa -- no malignancy    Plan:   1. Stone prevention discussed:   A) Increase fluid consumption to make at least 2-2.5 litre of urine per day or consume at least 3 liters of fluid daily.  B) To decrease Calcium in urine, Decrease dietary Salt (or Sodium) intake to <3200 mg/day.    C) To decrease Oxalate in urine, Avoid dark drinks (ie, Coffee, Tea, Colas, etc), nuts, chocolate   D) Lemonade: 4 oz lemon juice mixed with 2 litre water, sweetened to taste  2. Recommended follow up with PCP regarding her gallstone     Follow up in 1 year     Documentation assistance provided by Cheyenne AdasSarah Hollowell, medical scribe for Etheleen NicksPeter O Messina Kosinski, MD       Etheleen NicksPeter O Blanche Scovell, MD      Cc:  Rosilyn Mingshristian H Joyner, [place]

## 2016-05-04 ENCOUNTER — Ambulatory Visit
Admit: 2016-05-04 | Discharge: 2016-05-04 | Payer: PRIVATE HEALTH INSURANCE | Attending: Surgery | Primary: Physician Assistant

## 2016-05-04 DIAGNOSIS — K8020 Calculus of gallbladder without cholecystitis without obstruction: Secondary | ICD-10-CM

## 2016-05-04 NOTE — Patient Instructions (Signed)
If you have any questions or concerns about today's appointment, the verbal and/or written instructions you were given for follow up care, please call our office at [phone].    [facility]  [address], [address]  [place] [address]    [phone] office  [phone]

## 2016-05-04 NOTE — Progress Notes (Signed)
Darlene Espinoza is a 61 y.o. female who presents for a surgical evaluation of cholelithiasis.    1. Have you been to the ER, urgent care clinic since your last visit?  Hospitalized since your last visit? Yes, [facility] in 12/2015 for kidney stone.    2. Have you seen or consulted any other health care providers outside of the [facility] since your last visit?  Include any pap smears or colon screening. Yes, see previous   answer.    Patient verbally agrees to permit the medical students working in physician???s office to observe and participate in medical care during the appointment today, including, where appropriate, providing direct medical care to patient under the physician???s direct supervision. Patient agrees that he/she have been given the opportunity to refuse to give such consent and may withdraw consent at any time during appointment.

## 2016-05-04 NOTE — Progress Notes (Signed)
General Surgery Consult      Darlene Espinoza  Admit date: (Not on file)    MRN: [id number]     DOB: 12-Oct-1955     Age: 61 y.o.        Attending Physician: Earlyne Iba, MD, FACS      Subjective:     Darlene Espinoza is a 61 y.o. female with a history of cholelithiasis. I didn't I have seen the patient about one year ago for abdominal pain that was related to her kidney stones. It seems that the patient has 2 sister and a brother who had a complicated gallbladder surgeries and she is concerned about that. She denies any abdominal pain. She denies any nausea or vomiting. She had an ultrasound that showed cholelithiasis with no evidence of cholecystitis. She had a HIDA scan that showed biliary dyskinesia but that the CCK Injection caused abdominal pain.     Patient Active Problem List    Diagnosis Date Noted   ??? Right ureteral stone 01/13/2016   ??? Internal and external bleeding hemorrhoids 02/13/2014   ??? Anemia 02/13/2014     Past Medical History:   Diagnosis Date   ??? Constipation    ??? Gallstones 01/29/2015   ??? Hematuria    ??? Hemorrhoid    ??? Hypercholesterolemia    ??? Kidney stone    ??? Microscopic hematuria    ??? PONV (postoperative nausea and vomiting)       Past Surgical History:   Procedure Laterality Date   ??? HX COLONOSCOPY     ??? HX HYSTERECTOMY     ??? HX UROLOGICAL  06/20/2015    CHG.  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  Dr. Aurelio Jew.     ??? HX UROLOGICAL  12/08/2015    CYSTOURETHROSCOPY WITH INDWELLING URETERAL STENT INSERTION; RETROGRADE; Surgeon: Al Decant, MD      Social History   Substance Use Topics   ??? Smoking status: Never Smoker   ??? Smokeless tobacco: Never Used   ??? Alcohol use No      History   Smoking Status   ??? Never Smoker   Smokeless Tobacco   ??? Never Used     Family History   Problem Relation Age of Onset   ??? Hypertension Mother    ??? Elevated Lipids Father    ??? Gout Father    ??? Diabetes Father    ??? Breast Cancer Sister        Current Outpatient Prescriptions   Medication Sig   ??? simvastatin (ZOCOR) 20 mg tablet Take 10 mg by mouth every other day.   ??? FISH OIL 500 mg cap Take  by mouth.   ??? Cholecalciferol, Vitamin D3, (VITAMIN D3) 1,000 unit cap Take  by mouth.   ??? cinnamon bark (CINNAMON) 500 mg cap Take  by mouth.   ??? HYDROcodone-acetaminophen (NORCO) 5-325 mg per tablet Take 1 Tab by mouth every six (6) hours as needed for Pain. Max Daily Amount: 4 Tabs.   ??? oxybutynin (DITROPAN) 5 mg tablet TAKE 1 TABLET BY MOUTH TWICE DAILY FOR BLADDER SPASMS OR URINARY FREQUENCY     No current facility-administered medications for this visit.       Allergies   Allergen Reactions   ??? Alcohol Rash        Review of Systems:  Pertinent items are noted in the History of Present Illness.    Objective:     Visit Vitals   ???  BP 136/71 (BP 1 Location: Right arm, BP Patient Position: Sitting)   ??? Pulse 73   ??? Temp 97.8 ??F (36.6 ??C) (Oral)   ??? Resp 16   ??? Ht _0  (1.6 m)   ??? Wt 58.6 kg (129 lb 3.2 oz)   ??? SpO2 98%   ??? BMI 22.89 kg/m2       Physical Exam:      General:  in no apparent distress, alert and oriented times 3   Eyes:  conjunctivae and sclerae normal, pupils equal, round, reactive to light   Throat & Neck: no erythema or exudates noted and neck supple and symmetrical; no palpable masses   Lungs:   clear to auscultation bilaterally   Heart:  Regular rate and rhythm   Abdomen:   flat, soft, nontender, nondistended, no masses or organomegaly.    Extremities: extremities normal, atraumatic, no cyanosis or edema   Skin: Normal.         Imaging and Lab Review:     CBC:   Lab Results   Component Value Date/Time    WBC 4.4 01/06/2016 11:35 AM    RBC 4.11 01/06/2016 11:35 AM    HGB 12.3 01/06/2016 11:35 AM    HCT 38.5 01/06/2016 11:35 AM    PLATELET 234 01/06/2016 11:35 AM     BMP:   Lab Results   Component Value Date/Time    Glucose 96 01/06/2016 11:35 AM    Sodium 143 01/06/2016 11:35 AM    Potassium 4.1 01/06/2016 11:35 AM     Chloride 102 01/06/2016 11:35 AM    CO2 25 01/06/2016 11:35 AM    BUN 13 01/06/2016 11:35 AM    Creatinine 0.5 01/06/2016 11:35 AM    Calcium 8.8 01/06/2016 11:35 AM     CMP:  Lab Results   Component Value Date/Time    Glucose 96 01/06/2016 11:35 AM    Sodium 143 01/06/2016 11:35 AM    Potassium 4.1 01/06/2016 11:35 AM    Chloride 102 01/06/2016 11:35 AM    CO2 25 01/06/2016 11:35 AM    BUN 13 01/06/2016 11:35 AM    Creatinine 0.5 01/06/2016 11:35 AM    Calcium 8.8 01/06/2016 11:35 AM    Anion gap 16.0 01/06/2016 11:35 AM    BUN/Creatinine ratio 16 01/07/2009 11:27 AM    Alk. phosphatase 72 01/07/2009 11:27 AM    Protein, total 7.9 01/07/2009 11:27 AM    Albumin 4.4 01/07/2009 11:27 AM    Globulin 3.5 01/07/2009 11:27 AM    A-G Ratio 1.3 01/07/2009 11:27 AM       No results found for this or any previous visit (from the past 24 hour(s)).    images and reports reviewed    Assessment:   Darlene Espinoza is a 61 y.o. female is presenting with cholelithiasis. I explained to the patient that she has to option, the first one is observation, since she has no abdominal pain and her ultrasound and HIDA scan did not show evidence of cholecystitis or biliary dyskinesia. But since the patient is very concerned about her gallstone, and she had abdominal pain in the past, and she had symptoms produced by this he CK injection, then I would offer a cholecystectomy if she wants to. I explained the indications for robotic cholecystectomy as well as the alternatives. I discussed the potential risks, including but not limited to bleeding, wound infection, trocar injuries, bile duct injury and leak, and also the possible need for  conversion to open procedure. I also explained the firefly technology and how it allow [place] to visualize the biliary tree to avoid bile duct injury or leak. She indicates that she understands the risks, accepts and wishes to wait on the surgery    Plan:     Close observation  Follow up as needed      Please call me if you have any questions (cell phone: [phone])     Signed By: Earlyne Iba, MD     May 04, 2016

## 2016-06-01 ENCOUNTER — Encounter: Attending: Surgery | Primary: Physician Assistant

## 2016-06-01 ENCOUNTER — Ambulatory Visit
Admit: 2016-06-01 | Discharge: 2016-06-01 | Payer: PRIVATE HEALTH INSURANCE | Attending: Surgery | Primary: Physician Assistant

## 2016-06-01 DIAGNOSIS — K644 Residual hemorrhoidal skin tags: Secondary | ICD-10-CM

## 2016-06-01 MED ORDER — HYDROCORTISONE 2.5 % RECTAL CREAM
2.5 % | Freq: Three times a day (TID) | CUTANEOUS | 2 refills | Status: AC | PRN
Start: 2016-06-01 — End: ?

## 2016-06-01 NOTE — Progress Notes (Signed)
[facility]  Colon and Rectal Surgery  [address], [address]  [place], [place] [id number]              Colon and Rectal Surgery          Patient: Darlene AlconLynn Yap Espinoza  MRN: [id number]  Date: 06/01/2016     Age:  61 y.o.,      Sex: female    Date of Birth:  [birth year]      Subjective    Darlene Espinoza is an 61 y.o. female who presents with recent bright red anal outlet bleeding.  She has been straining to defecate and not helped by her intermittent constipation.    The patient underwent Complex 3 Quadrant External and Internal Hemorrhoidectomy for moderate to severe hemorrhoid disease on 03/16/2014.  She also underwent a colonoscopy exam on 03/14/2014 with normal findings otherwise.    She is concerned about the bleeding.  The patient denies any significant anorectal pain, fever, weight changes, nor any abdominal pain.         Past Medical History:   Diagnosis Date   ??? Constipation    ??? Gallstones 01/29/2015   ??? Hematuria    ??? Hemorrhoid    ??? Hypercholesterolemia    ??? Kidney stone    ??? Microscopic hematuria    ??? PONV (postoperative nausea and vomiting)        Past Surgical History:   Procedure Laterality Date   ??? HX COLONOSCOPY     ??? HX HYSTERECTOMY     ??? HX UROLOGICAL  06/20/2015    CHG.  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  Dr. Esmeralda ArthurBrugh.     ??? HX UROLOGICAL  12/08/2015    CYSTOURETHROSCOPY WITH INDWELLING URETERAL STENT INSERTION; RETROGRADE; Surgeon: Robbi GarterKwong, Peter, MD       Allergies   Allergen Reactions   ??? Alcohol Rash       Prior to Admission medications    Medication Sig Start Date End Date Taking? Authorizing Provider   alendronate (FOSAMAX) 35 mg tablet TK 1 T PO WEEKLY 04/14/16  Yes Historical Provider   hydrocortisone (PROCTOSOL HC) 2.5 % rectal cream Insert  into rectum three (3) times daily as needed for Hemorrhoids. 06/01/16  Yes Estil Dafthong S Ercilia Bettinger, MD   simvastatin (ZOCOR) 20 mg tablet Take 10 mg by mouth every other day.    Yes Historical Provider   FISH OIL 500 mg cap Take  by mouth.   Yes Historical Provider   Cholecalciferol, Vitamin D3, (VITAMIN D3) 1,000 unit cap Take  by mouth.   Yes Historical Provider   cinnamon bark (CINNAMON) 500 mg cap Take  by mouth.   Yes Historical Provider   HYDROcodone-acetaminophen (NORCO) 5-325 mg per tablet Take 1 Tab by mouth every six (6) hours as needed for Pain. Max Daily Amount: 4 Tabs. 01/13/16   Etheleen NicksPeter O Kwong, MD   oxybutynin (DITROPAN) 5 mg tablet TAKE 1 TABLET BY MOUTH TWICE DAILY FOR BLADDER SPASMS OR URINARY FREQUENCY 12/09/15   Etheleen NicksPeter O Kwong, MD       Current Outpatient Prescriptions   Medication Sig Dispense Refill   ??? alendronate (FOSAMAX) 35 mg tablet TK 1 T PO WEEKLY  1   ??? hydrocortisone (PROCTOSOL HC) 2.5 % rectal cream Insert  into rectum three (3) times daily as needed for Hemorrhoids. 30 g 2   ??? simvastatin (ZOCOR) 20 mg tablet Take 10 mg by mouth every other day.     ???  FISH OIL 500 mg cap Take  by mouth.     ??? Cholecalciferol, Vitamin D3, (VITAMIN D3) 1,000 unit cap Take  by mouth.     ??? cinnamon bark (CINNAMON) 500 mg cap Take  by mouth.     ??? HYDROcodone-acetaminophen (NORCO) 5-325 mg per tablet Take 1 Tab by mouth every six (6) hours as needed for Pain. Max Daily Amount: 4 Tabs. 12 Tab 0   ??? oxybutynin (DITROPAN) 5 mg tablet TAKE 1 TABLET BY MOUTH TWICE DAILY FOR BLADDER SPASMS OR URINARY FREQUENCY 180 Tab 1       Social History     Social History   ??? Marital status: MARRIED     Spouse name: N/A   ??? Number of children: N/A   ??? Years of education: N/A     Occupational History   ??? [employer]      Social History Main Topics   ??? Smoking status: Never Smoker   ??? Smokeless tobacco: Never Used   ??? Alcohol use No   ??? Drug use: No   ??? Sexual activity: Not on file     Other Topics Concern   ??? Not on file     Social History Narrative    ** Merged History Encounter **            Family History   Problem Relation Age of Onset   ??? Hypertension Mother    ??? Elevated Lipids Father     ??? Gout Father    ??? Diabetes Father    ??? Breast Cancer Sister            Objective:        Visit Vitals   ??? BP 133/76   ??? Pulse 67   ??? Temp 96.4 ??F (35.8 ??C) (Oral)   ??? Resp 16   ??? Ht 5\' 3"  (1.6 m)   ??? Wt 57.6 kg (127 lb)   ??? SpO2 98%   ??? BMI 22.5 kg/m2       Physical Exam:   GENERAL: alert, cooperative, no distress, appears stated age  LUNG: clear to auscultation bilaterally  HEART: regular rate and rhythm, S1, S2 normal, no murmur, click, rub or gallop  EXTREMITIES:  extremities normal, atraumatic, no cyanosis or edema     Anorectal:  With the patient in the prone position the anus showed a small to moderate slightly inflamed sentinel tag anteriorly.  Digital rectal examination revealed increased sphincter tone and squeeze pressure.  Palpation revealed No Masses.    Anoscopy revealed normal findings otherwise    Assessment / Plan    Darlene Espinoza is an 61 y.o. female with a sentinel tag with some associated bleeding and chronic constipation.     The patient was reassured, and I recommended starting the management regimen consisting of:    1. Frequent sitz baths at home.  2. High fiber diet, with bulk laxatives if needed.  3. Application of Proctosol HC cream as instructed.    The patient will follow up in my clinic in about 1-2 months.            Estil Daft, MD, FACS, FASCRS  Colon and Rectal Surgery  [facility]  Office [phone]  Fax     [phone]  06/01/2016  1:41 PM

## 2016-06-01 NOTE — Progress Notes (Signed)
1. Have you been to the ER, urgent care clinic since your last visit?  Hospitalized since your last visit?No    2. Have you seen or consulted any other health care providers outside of the [facility] since your last visit?  Include any pap smears or colon screening. No     Patient presents for evaluation of 2 weeks of rectal bleeding. She reports having to strain with BM.

## 2016-06-01 NOTE — Patient Instructions (Signed)
If you have any questions or concerns about today's appointment, the verbal and/or written instructions you were given for follow up care, please call our office at [phone].    [facility]  [address], [address]  [place] [address]    [phone] office  [phone]

## 2016-06-03 NOTE — Telephone Encounter (Signed)
Received message that patient had called the clinic with questions about metamucil.     Patient would like to know if she is to consume daily. Informed her that per Dr. Nedra HaiLee she is to take daily.    Patient would like to know if she can take the metamucil caps rather than the powder. Informed her that this should be acceptable.

## 2016-08-10 ENCOUNTER — Encounter: Attending: Surgery | Primary: Physician Assistant

## 2017-02-25 ENCOUNTER — Encounter: Attending: Urology | Primary: Physician Assistant

## 2017-04-30 DIAGNOSIS — E782 Mixed hyperlipidemia: Secondary | ICD-10-CM | POA: Insufficient documentation

## 2018-10-05 ENCOUNTER — Encounter

## 2018-10-10 ENCOUNTER — Inpatient Hospital Stay: Admit: 2018-10-10 | Payer: MEDICARE | Attending: Family Medicine | Primary: Physician Assistant

## 2018-10-10 DIAGNOSIS — Z13820 Encounter for screening for osteoporosis: Secondary | ICD-10-CM

## 2018-11-15 ENCOUNTER — Encounter

## 2018-11-15 ENCOUNTER — Inpatient Hospital Stay: Admit: 2018-11-15 | Payer: MEDICARE | Primary: Physician Assistant

## 2018-11-15 DIAGNOSIS — Z01810 Encounter for preprocedural cardiovascular examination: Secondary | ICD-10-CM

## 2019-01-02 ENCOUNTER — Inpatient Hospital Stay: Admit: 2019-01-02 | Payer: MEDICARE | Primary: Physician Assistant

## 2019-01-02 ENCOUNTER — Encounter

## 2019-01-02 DIAGNOSIS — Z01818 Encounter for other preprocedural examination: Secondary | ICD-10-CM

## 2019-10-25 ENCOUNTER — Telehealth: Payer: Self-pay | Admitting: Gastroenterology

## 2019-10-25 NOTE — Telephone Encounter (Signed)
Recieved patients records, patient would like to call back with what MD she would like to review records.

## 2019-11-16 NOTE — Telephone Encounter (Signed)
I'm happy to see her for routine clinic visit. Thanks

## 2019-11-16 NOTE — Telephone Encounter (Signed)
Hey Dr. Havery Moros, Patient has sent [place] her records from her last GI that she seen the beginning of this year. She states she wants to transfer and have you as her doctor. Needs to be seen for Hep B. I have the records and will send to you for review. Please advise. Thank you!

## 2019-11-20 NOTE — Telephone Encounter (Signed)
Left message for patient to call back and schedule.

## 2019-12-05 ENCOUNTER — Encounter: Payer: Self-pay | Admitting: Gastroenterology

## 2020-01-22 NOTE — Progress Notes (Signed)
Referral paperwork from [facility] [place].

## 2020-02-06 ENCOUNTER — Ambulatory Visit (INDEPENDENT_AMBULATORY_CARE_PROVIDER_SITE_OTHER): Payer: Medicare PPO | Admitting: Gastroenterology

## 2020-02-06 ENCOUNTER — Other Ambulatory Visit (INDEPENDENT_AMBULATORY_CARE_PROVIDER_SITE_OTHER): Payer: Medicare PPO

## 2020-02-06 ENCOUNTER — Encounter: Payer: Self-pay | Admitting: Gastroenterology

## 2020-02-06 VITALS — BP 118/80 | HR 81 | Ht 62.0 in | Wt 131.2 lb

## 2020-02-06 DIAGNOSIS — B181 Chronic viral hepatitis B without delta-agent: Secondary | ICD-10-CM

## 2020-02-06 DIAGNOSIS — Z23 Encounter for immunization: Secondary | ICD-10-CM

## 2020-02-06 DIAGNOSIS — Z1211 Encounter for screening for malignant neoplasm of colon: Secondary | ICD-10-CM

## 2020-02-06 LAB — CBC WITH DIFFERENTIAL/PLATELET
Basophils Absolute: 0 10*3/uL (ref 0.0–0.1)
Basophils Relative: 0.8 % (ref 0.0–3.0)
Eosinophils Absolute: 0.1 10*3/uL (ref 0.0–0.7)
Eosinophils Relative: 1.1 % (ref 0.0–5.0)
HCT: 39.5 % (ref 36.0–46.0)
Hemoglobin: 13.1 g/dL (ref 12.0–15.0)
Lymphocytes Relative: 30.9 % (ref 12.0–46.0)
Lymphs Abs: 1.4 10*3/uL (ref 0.7–4.0)
MCHC: 33.3 g/dL (ref 30.0–36.0)
MCV: 89.7 fl (ref 78.0–100.0)
Monocytes Absolute: 0.4 10*3/uL (ref 0.1–1.0)
Monocytes Relative: 8.8 % (ref 3.0–12.0)
Neutro Abs: 2.7 10*3/uL (ref 1.4–7.7)
Neutrophils Relative %: 58.4 % (ref 43.0–77.0)
Platelets: 285 10*3/uL (ref 150.0–400.0)
RBC: 4.4 Mil/uL (ref 3.87–5.11)
RDW: 13.6 % (ref 11.5–15.5)
WBC: 4.6 10*3/uL (ref 4.0–10.5)

## 2020-02-06 LAB — HEPATIC FUNCTION PANEL
ALT: 25 U/L (ref 0–35)
AST: 22 U/L (ref 0–37)
Albumin: 4.7 g/dL (ref 3.5–5.2)
Alkaline Phosphatase: 62 U/L (ref 39–117)
Bilirubin, Direct: 0.2 mg/dL (ref 0.0–0.3)
Total Bilirubin: 1 mg/dL (ref 0.2–1.2)
Total Protein: 8 g/dL (ref 6.0–8.3)

## 2020-02-06 MED ORDER — SUTAB 1479-225-188 MG PO TABS: 1.0000 | ORAL_TABLET | Freq: Once | ORAL | 0 refills | Status: AC

## 2020-02-06 NOTE — Patient Instructions (Signed)
If you are age 66 or older, your body mass index should be between 23-30. Your Body mass index is 24 kg/m. If this is out of the aforementioned range listed, please consider follow up with your Primary Care Provider.  If you are age 67 or younger, your body mass index should be between 19-25. Your Body mass index is 24 kg/m. If this is out of the aformentioned range listed, please consider follow up with your Primary Care Provider.   Please go to the lab in the basement of our building to have lab work done as you leave today. Hit "B" for basement when you get on the elevator.  When the doors open the lab is on your left.  We will call you with the results. Thank you.  You have been scheduled for an RUQ ultrasound with Elastography at [facility] (1st floor of hospital) on Wednesday, 11-17 at 9:30am. Please arrive 15 minutes prior to your appointment for registration. Make certain not to have anything to eat or drink 6 hours prior to your appointment. Should you need to reschedule your appointment, please contact radiology at [phone]. This test typically takes about 30 minutes to perform.   You have been scheduled for a colonoscopy. Please follow written instructions given to you at your visit today.  Please pick up your prep supplies at the pharmacy within the next 1-3 days. If you use inhalers (even only as needed), please bring them with you on the day of your procedure.   We are giving you a flu shot today and a Vaccine Information Sheet.   Thank you for entrusting me with your care and for choosing [facility], Dr. Baird Cellar

## 2020-02-06 NOTE — Progress Notes (Signed)
HPI :  64 year old female with a history of chronic hepatitis B, hyperlipidemia, osteopenia, self-referred here to establish for care for chronic hepatitis B.  She recently moved from [facility], now lives with her daughter in [place].  Records reviewed regarding her history as outlined below.  She has been followed for chronic hepatitis B by GI provider in [place] for the past few years.  Unclear exactly when this was diagnosed.  She denies any history of jaundice or personal history of liver disease.  She denies any family history of hepatitis B, she thinks her uncle did have liver cancer.  She was born in the [place].  She denies any alcohol use.  She has tested negative to hep C in the past as well per labs as outlined below.  Historically her liver enzymes have been normal.  She has a reported history of hepatitis B E antigen negative, with viral load ranging from 10-20,000.  She had a reported FibroScan of the liver which was normal about a year ago.  She had an ultrasound in June 2021 showing coarsened liver echotexture.  No known history of cirrhosis.  She has never received any medical therapy for hepatitis B in the past.  Her last colonoscopy upon questioning was in 2006.  She denies any problems with her bowels.  She has normally soft stools post cholecystectomy.  No blood in her stool.  No family history of colon cancer.  She states she has had some nausea with anesthesia in the past.  Of note the patient is inquiring about getting a flu shot at our office today.  She was notified that we do not have the high-dose flu vaccine which is indicated for her age group but she wanted to proceed with it anyway.  Records reviewed: 02/10/19 - fibroscan normal liver Hep A total AB positive Hep C negative Iron sat 32%, ANA negative (+) hep B  05/17/19 - hep B surface antigen (+), hep B core AB (+), Hep B e AG (-), HBV DNA 11,400  09/15/19 - HBV DNA 29,700, LFTs normal - ALT 31, AST  28, AFP 2.8  09/01/19 - RUQ [place] - coarsened liver echotexture, no solid mass lesions - results c/w hepatic steatosis or hepatic dysfunction   Colonoscopy 2006, flex sig 2012    Past Medical History:  Diagnosis Date  . Chronic hepatitis B ([place])   . Hyperlipemia   . Osteopenia   . Vitamin D deficiency      Past Surgical History:  Procedure Laterality Date  . ABDOMINAL HYSTERECTOMY    . BREAST LUMPECTOMY    . CATARACT EXTRACTION    . CHOLECYSTECTOMY, LAPAROSCOPIC    . COLONOSCOPY  2006  . flex sigmoidoscopy  2012  . kidney stones     Family History  Problem Relation Age of Onset  . Hypertension Mother   . Diabetes Father   . Hypertension Father    Social History   Tobacco Use  . Smoking status: Never Smoker  . Smokeless tobacco: Never Used  Vaping Use  . Vaping Use: Never used  Substance Use Topics  . Alcohol use: Never  . Drug use: Never   Current Outpatient Medications  Medication Sig Dispense Refill  . alendronate (FOSAMAX) 35 MG tablet Take 35 mg by mouth every 7 (seven) days. Take with a full glass of water on an empty stomach.    . Ascorbic Acid (VITAMIN C WITH ROSE HIPS) 500 MG tablet Take 500 mg by mouth daily.    Marland Kitchen  atorvastatin (LIPITOR) 20 MG tablet Take 20 mg by mouth daily.    . Cholecalciferol (VITAMIN D3) 25 MCG (1000 UT) CAPS Take 1 capsule by mouth daily.    . Cinnamon 500 MG TABS Take 2 tablets by mouth in the morning and at bedtime.    . Multiple Vitamin (MULTIVITAMIN) tablet Take 1 tablet by mouth daily.    . Omega-3 Fatty Acids (FISH OIL) 1000 MG CAPS Take by mouth in the morning and at bedtime.    Marland Kitchen QUERCETIN PO Take 500 mg by mouth daily.    . Zinc Sulfate (ZINC 15) 66 MG TABS Take by mouth.     No current facility-administered medications for this visit.   No Known Allergies   Review of Systems: All systems reviewed and negative except where noted in HPI.    Labs per HPI, none on file in our system  Physical Exam: BP 118/80 (BP  Location: Left Arm, Patient Position: Sitting)   Pulse 81   Ht 5\' 2"  (1.575 m)   Wt 131 lb 3.2 oz (59.5 kg)   SpO2 97%   BMI 24.00 kg/m  Constitutional: Pleasant,well-developed, female in no acute distress. HEENT: Normocephalic and atraumatic. Conjunctivae are normal. No scleral icterus. Neck supple.  Cardiovascular: Normal rate, regular rhythm.  Pulmonary/chest: Effort normal and breath sounds normal.  Abdominal: Soft, nondistended, nontender.  There are no masses palpable. No hepatomegaly. Extremities: no edema Lymphadenopathy: No cervical adenopathy noted. Neurological: Alert and oriented to person place and time. Skin: Skin is warm and dry. No rashes noted. Psychiatric: Normal mood and affect. Behavior is normal.   ASSESSMENT AND PLAN: 64 year old female here for new patient assessment of the following:  Chronic hepatitis B - appears to have chronic hepatitis B E antigen negative with normal liver enzymes and a viral load between 10 and 20,000.  She is never had treatment for this in the past.  I discussed hepatitis B in general with her, risks for cirrhosis and [place] moving forward.  As long as her liver enzymes remain normal, she does not have any evidence of cirrhosis or significant inflammatory changes in her liver, we can continue to observe this as long as her viral load remains stable.  If she has inflammatory changes in her liver /cirrhosis we may want to treat this.  Recommending labs today to include LFTs, CBC, hepatitis B DNA level, hepatitis B E antigen and hepatitis B E antibody to clarify that status, as well as AFP level.  She is also due shortly for [facility] screening, will pursue ultrasound with elastography to assess for fibrotic change and underlying cirrhosis.  Once we have that information back we can determine if she warrants therapy or not.  Several minutes spent discussing this in her history with her, she agreed with the plan.    Colon cancer screening - patient is due  for routine colon cancer screening.  I discussed options with her to include stool based testing versus optical colonoscopy.  Risks and benefits of each.  Following discussion of optical colonoscopy she wants to proceed with that.  She preferred a Sutab prep after discussion of options, further recommendations pending the results.  Influenza vaccine - patient requesting low-dose flu vaccine today, this was provided to her, after explanation of flu shot. Based on her age she would warrant higher dose flu shot but she declined that.   Otherwise she has recently moved to the area, she is due to establish with a primary care in January.  Epps Cellar, MD [facility]

## 2020-02-12 LAB — HEPATITIS B DNA, ULTRAQUANTITATIVE, PCR

## 2020-02-12 LAB — HEPATITIS B E ANTIBODY: Hep B E Ab: REACTIVE — AB

## 2020-02-12 LAB — AFP TUMOR MARKER: AFP-Tumor Marker: 3.2 ng/mL

## 2020-02-12 LAB — HEPATITIS B E ANTIGEN: Hep B E Ag: NONREACTIVE

## 2020-02-13 ENCOUNTER — Other Ambulatory Visit: Payer: Self-pay

## 2020-02-13 ENCOUNTER — Other Ambulatory Visit: Payer: Medicare PPO

## 2020-02-13 DIAGNOSIS — B181 Chronic viral hepatitis B without delta-agent: Secondary | ICD-10-CM

## 2020-02-14 ENCOUNTER — Ambulatory Visit (HOSPITAL_COMMUNITY): Payer: Medicare PPO

## 2020-02-15 ENCOUNTER — Other Ambulatory Visit: Payer: Self-pay

## 2020-02-15 ENCOUNTER — Ambulatory Visit (AMBULATORY_SURGERY_CENTER): Payer: Medicare PPO | Admitting: Gastroenterology

## 2020-02-15 ENCOUNTER — Other Ambulatory Visit: Payer: Medicare PPO

## 2020-02-15 ENCOUNTER — Telehealth: Payer: Self-pay

## 2020-02-15 ENCOUNTER — Encounter: Payer: Self-pay | Admitting: Gastroenterology

## 2020-02-15 VITALS — BP 136/69 | HR 64 | Temp 97.6°F | Resp 20 | Ht 62.0 in | Wt 131.0 lb

## 2020-02-15 DIAGNOSIS — D121 Benign neoplasm of appendix: Secondary | ICD-10-CM

## 2020-02-15 DIAGNOSIS — B181 Chronic viral hepatitis B without delta-agent: Secondary | ICD-10-CM

## 2020-02-15 DIAGNOSIS — D127 Benign neoplasm of rectosigmoid junction: Secondary | ICD-10-CM

## 2020-02-15 DIAGNOSIS — Z1211 Encounter for screening for malignant neoplasm of colon: Secondary | ICD-10-CM

## 2020-02-15 DIAGNOSIS — K635 Polyp of colon: Secondary | ICD-10-CM

## 2020-02-15 MED ORDER — SODIUM CHLORIDE 0.9 % IV SOLN: 500.0000 mL | Freq: Once | INTRAVENOUS | Status: DC

## 2020-02-15 NOTE — Telephone Encounter (Signed)
-----   Message from Yevette Edwards, RN sent at 02/13/2020  8:48 AM EST ----- Regarding: Labs Repeat Hep B DNA, order in epic

## 2020-02-15 NOTE — Telephone Encounter (Signed)
Left detailed message on patient's voicemail to remind her to come in for lab work either before or after her appt today. Advised patient that if she has any questions to give me a call.

## 2020-02-15 NOTE — Telephone Encounter (Signed)
Advised pt of message, pt voiced understanding

## 2020-02-15 NOTE — Patient Instructions (Signed)
Handouts given:  Polyps, Hemorrhoids Resume previous diet Continue current medications Await pathology results  YOU HAD AN ENDOSCOPIC PROCEDURE TODAY AT [place]:   Refer to the procedure report that was given to you for any specific questions about what was found during the examination.  If the procedure report does not answer your questions, please call your gastroenterologist to clarify.  If you requested that your care partner not be given the details of your procedure findings, then the procedure report has been included in a sealed envelope for you to review at your convenience later.  YOU SHOULD EXPECT: Some feelings of bloating in the abdomen. Passage of more gas than usual.  Walking can help get rid of the air that was put into your GI tract during the procedure and reduce the bloating. If you had a lower endoscopy (such as a colonoscopy or flexible sigmoidoscopy) you may notice spotting of blood in your stool or on the toilet paper. If you underwent a bowel prep for your procedure, you may not have a normal bowel movement for a few days.  Please Note:  You might notice some irritation and congestion in your nose or some drainage.  This is from the oxygen used during your procedure.  There is no need for concern and it should clear up in a day or so.  SYMPTOMS TO REPORT IMMEDIATELY:   Following lower endoscopy (colonoscopy or flexible sigmoidoscopy):  Excessive amounts of blood in the stool  Significant tenderness or worsening of abdominal pains  Swelling of the abdomen that is new, acute  Fever of 100F or higher  For urgent or emergent issues, a gastroenterologist can be reached at any hour by calling [phone]. Do not use MyChart messaging for urgent concerns.   DIET:  We do recommend a small meal at first, but then you may proceed to your regular diet.  Drink plenty of fluids but you should avoid alcoholic beverages for 24 hours.  ACTIVITY:  You should  plan to take it easy for the rest of today and you should NOT DRIVE or use heavy machinery until tomorrow (because of the sedation medicines used during the test).    FOLLOW UP: Our staff will call the number listed on your records 48-72 hours following your procedure to check on you and address any questions or concerns that you may have regarding the information given to you following your procedure. If we do not reach you, we will leave a message.  We will attempt to reach you two times.  During this call, we will ask if you have developed any symptoms of COVID 19. If you develop any symptoms (ie: fever, flu-like symptoms, shortness of breath, cough etc.) before then, please call [phone].  If you test positive for Covid 19 in the 2 weeks post procedure, please call and report this information to [place].    If any biopsies were taken you will be contacted by phone or by letter within the next 1-3 weeks.  Please call us at [phone] if you have not heard about the biopsies in 3 weeks.   SIGNATURES/CONFIDENTIALITY: You and/or your care partner have signed paperwork which will be entered into your electronic medical record.  These signatures attest to the fact that that the information above on your After Visit Summary has been reviewed and is understood.  Full responsibility of the confidentiality of this discharge information lies with you and/or your care-partner.

## 2020-02-15 NOTE — Progress Notes (Signed)
A and O x3. Report to RN. Tolerated MAC anesthesia well. 

## 2020-02-15 NOTE — Progress Notes (Signed)
Called to room to assist during endoscopic procedure.  Patient ID and intended procedure confirmed with present staff. Received instructions for my participation in the procedure from the performing physician.  

## 2020-02-15 NOTE — Op Note (Signed)
[facility] Patient Name: Wanda Willis Procedure Date: 02/15/2020 11:47 AM MRN: [id number] Endoscopist: Remo Lipps P. Havery Moros , MD Age: 64 Referring MD:  Date of Birth: 06-Jul-1955 Gender: Female Account #: [id number] Procedure:                Colonoscopy Indications:              Screening for colorectal malignant neoplasm, This                            is the patient's first colonoscopy Medicines:                Monitored Anesthesia Care Procedure:                Pre-Anesthesia Assessment:                           - Prior to the procedure, a History and Physical                            was performed, and patient medications and                            allergies were reviewed. The patient's tolerance of                            previous anesthesia was also reviewed. The risks                            and benefits of the procedure and the sedation                            options and risks were discussed with the patient.                            All questions were answered, and informed consent                            was obtained. Prior Anticoagulants: The patient has                            taken no previous anticoagulant or antiplatelet                            agents. ASA Grade Assessment: II - A patient with                            mild systemic disease. After reviewing the risks                            and benefits, the patient was deemed in                            satisfactory condition to undergo the procedure.  After obtaining informed consent, the colonoscope                            was passed under direct vision. Throughout the                            procedure, the patient's blood pressure, pulse, and                            oxygen saturations were monitored continuously. The                            Colonoscope was introduced through the anus and                            advanced to the the  cecum, identified by                            appendiceal orifice and ileocecal valve. The                            colonoscopy was performed without difficulty. The                            patient tolerated the procedure well. The quality                            of the bowel preparation was good. The ileocecal                            valve, appendiceal orifice, and rectum were                            photographed. Scope In: 11:58:44 AM Scope Out: 12:20:01 PM Scope Withdrawal Time: 0 hours 18 minutes 24 seconds  Total Procedure Duration: 0 hours 21 minutes 17 seconds  Findings:                 The perianal and digital rectal examinations were                            normal.                           A suspected 5 to 6 mm polyp vs. normal varient was                            found in the appendiceal orifice. The polyp was                            flat. Margins were a bit hard to delineate. I don't                            think obviously involving down into the lumen of  the appendix but was difficult to visualize in that                            area. The polyp was removed with a cold snare.                            Resection and retrieval were thought to have been                            complete.                           A 3 mm polyp was found in the sigmoid colon. The                            polyp was sessile. The polyp was removed with a                            cold snare. Resection and retrieval were complete.                           Internal hemorrhoids were found during                            retroflexion. The hemorrhoids were small.                           The exam was otherwise without abnormality. Complications:            No immediate complications. Estimated blood loss:                            Minimal. Estimated Blood Loss:     Estimated blood loss was minimal. Impression:               - One 5 to 6  mm flat polyp vs. benign hyperplastic                            variant, at the appendiceal orifice, removed with a                            cold snare as above. Resected and retrieved.                           - One 3 mm polyp in the sigmoid colon, removed with                            a cold snare. Resected and retrieved.                           - Internal hemorrhoids.                           - The examination was otherwise normal. Recommendation:           -  Patient has a contact number available for                            emergencies. The signs and symptoms of potential                            delayed complications were discussed with the                            patient. Return to normal activities tomorrow.                            Written discharge instructions were provided to the                            patient.                           - Resume previous diet.                           - Continue present medications.                           - Await pathology results. Remo Lipps P. Yaritzi Craun, MD 02/15/2020 12:27:00 PM This report has been signed electronically.

## 2020-02-18 LAB — HEPATITIS B DNA, ULTRAQUANTITATIVE, PCR
Hepatitis B DNA (Calc): 3.93 Log IU/mL — ABNORMAL HIGH
Hepatitis B DNA: 8520 IU/mL — ABNORMAL HIGH

## 2020-02-19 ENCOUNTER — Telehealth: Payer: Self-pay | Admitting: *Deleted

## 2020-02-19 NOTE — Telephone Encounter (Signed)
  Follow up Call-  Call back number 02/15/2020  Post procedure Call Back phone  # [phone]  Permission to leave phone message Yes     Patient questions:  Do you have a fever, pain , or abdominal swelling? No. Pain Score  0 *  Have you tolerated food without any problems? Yes.    Have you been able to return to your normal activities? Yes.    Do you have any questions about your discharge instructions: Diet   No. Medications  No. Follow up visit  No.  Do you have questions or concerns about your Care? No.  Actions: * If pain score is 4 or above: No action needed, pain <4.  1. Have you developed a fever since your procedure? no  2.   Have you had an respiratory symptoms (SOB or cough) since your procedure? no  3.   Have you tested positive for COVID 19 since your procedure no  4.   Have you had any family members/close contacts diagnosed with the COVID 19 since your procedure?  no   If yes to any of these questions please route to Joylene John, RN and Joella Prince, RN

## 2020-02-20 ENCOUNTER — Encounter: Payer: Self-pay | Admitting: Gastroenterology

## 2020-02-27 ENCOUNTER — Ambulatory Visit (HOSPITAL_COMMUNITY)
Admission: RE | Admit: 2020-02-27 | Discharge: 2020-02-27 | Disposition: A | Discharge status: Home / Self Care | Payer: Medicare PPO | Source: Ambulatory Visit | Attending: Gastroenterology | Admitting: Gastroenterology

## 2020-02-27 ENCOUNTER — Other Ambulatory Visit: Payer: Self-pay

## 2020-02-27 DIAGNOSIS — Z1211 Encounter for screening for malignant neoplasm of colon: Secondary | ICD-10-CM | POA: Insufficient documentation

## 2020-02-27 DIAGNOSIS — B181 Chronic viral hepatitis B without delta-agent: Secondary | ICD-10-CM

## 2020-02-28 ENCOUNTER — Other Ambulatory Visit: Payer: Self-pay

## 2020-02-28 DIAGNOSIS — B181 Chronic viral hepatitis B without delta-agent: Secondary | ICD-10-CM

## 2020-02-28 NOTE — Progress Notes (Signed)
He b dna

## 2020-04-17 ENCOUNTER — Ambulatory Visit: Payer: Self-pay | Admitting: Family Medicine

## 2020-05-17 ENCOUNTER — Encounter: Payer: Self-pay | Admitting: Family Medicine

## 2020-05-17 ENCOUNTER — Ambulatory Visit (INDEPENDENT_AMBULATORY_CARE_PROVIDER_SITE_OTHER): Payer: Medicare PPO | Admitting: Family Medicine

## 2020-05-17 ENCOUNTER — Other Ambulatory Visit: Payer: Self-pay

## 2020-05-17 ENCOUNTER — Telehealth: Payer: Self-pay

## 2020-05-17 VITALS — BP 100/62 | HR 71 | Temp 98.3°F | Ht 62.0 in | Wt 132.4 lb

## 2020-05-17 DIAGNOSIS — M810 Age-related osteoporosis without current pathological fracture: Secondary | ICD-10-CM | POA: Diagnosis not present

## 2020-05-17 DIAGNOSIS — R7303 Prediabetes: Secondary | ICD-10-CM | POA: Diagnosis not present

## 2020-05-17 DIAGNOSIS — E782 Mixed hyperlipidemia: Secondary | ICD-10-CM | POA: Diagnosis not present

## 2020-05-17 DIAGNOSIS — B1910 Unspecified viral hepatitis B without hepatic coma: Secondary | ICD-10-CM

## 2020-05-17 DIAGNOSIS — R143 Flatulence: Secondary | ICD-10-CM

## 2020-05-17 LAB — LIPID PANEL
Cholesterol: 146 mg/dL (ref 0–200)
HDL: 53.1 mg/dL (ref >39.00)
LDL Cholesterol: 67 mg/dL (ref 0–99)
NonHDL: 93.19
Total CHOL/HDL Ratio: 3
Triglycerides: 133 mg/dL (ref 0.0–149.0)
VLDL: 26.6 mg/dL (ref 0.0–40.0)

## 2020-05-17 LAB — COMPREHENSIVE METABOLIC PANEL
ALT: 34 U/L (ref 0–35)
AST: 30 U/L (ref 0–37)
Albumin: 4.5 g/dL (ref 3.5–5.2)
Alkaline Phosphatase: 49 U/L (ref 39–117)
BUN: 10 mg/dL (ref 6–23)
CO2: 30 mEq/L (ref 19–32)
Calcium: 9.9 mg/dL (ref 8.4–10.5)
Chloride: 103 mEq/L (ref 96–112)
Creatinine, Ser: 0.75 mg/dL (ref 0.40–1.20)
GFR: 82.67 mL/min (ref >60.00)
Glucose, Bld: 93 mg/dL (ref 70–99)
Potassium: 4.6 mEq/L (ref 3.5–5.1)
Sodium: 139 mEq/L (ref 135–145)
Total Bilirubin: 1.1 mg/dL (ref 0.2–1.2)
Total Protein: 7.9 g/dL (ref 6.0–8.3)

## 2020-05-17 LAB — CBC WITH DIFFERENTIAL/PLATELET
Basophils Absolute: 0 10*3/uL (ref 0.0–0.1)
Basophils Relative: 0.8 % (ref 0.0–3.0)
Eosinophils Absolute: 0.1 10*3/uL (ref 0.0–0.7)
Eosinophils Relative: 1.3 % (ref 0.0–5.0)
HCT: 37.6 % (ref 36.0–46.0)
Hemoglobin: 12.7 g/dL (ref 12.0–15.0)
Lymphocytes Relative: 38 % (ref 12.0–46.0)
Lymphs Abs: 2.1 10*3/uL (ref 0.7–4.0)
MCHC: 33.8 g/dL (ref 30.0–36.0)
MCV: 89.6 fl (ref 78.0–100.0)
Monocytes Absolute: 0.4 10*3/uL (ref 0.1–1.0)
Monocytes Relative: 7.9 % (ref 3.0–12.0)
Neutro Abs: 2.9 10*3/uL (ref 1.4–7.7)
Neutrophils Relative %: 52 % (ref 43.0–77.0)
Platelets: 259 10*3/uL (ref 150.0–400.0)
RBC: 4.2 Mil/uL (ref 3.87–5.11)
RDW: 13.5 % (ref 11.5–15.5)
WBC: 5.6 10*3/uL (ref 4.0–10.5)

## 2020-05-17 LAB — HEMOGLOBIN A1C: Hgb A1c MFr Bld: 5.8 % (ref 4.6–6.5)

## 2020-05-17 MED ORDER — ATORVASTATIN CALCIUM 10 MG PO TABS: 10.0000 mg | ORAL_TABLET | Freq: Every day | ORAL | 3 refills | Status: DC

## 2020-05-17 NOTE — Telephone Encounter (Signed)
Pt states she is taking 35 mg of the medication discussed in the appt. Unable to fully understand medication name after asking pt 3 times.

## 2020-05-17 NOTE — Progress Notes (Signed)
Patient: Wanda Willis MRN: [id number] DOB: [birth year] PCP: Orma Flaming, MD     Subjective:  Chief Complaint  Patient presents with  . Establish Care  . Prediabetes  . Hyperlipidemia  . Osteoporosis    HPI: The patient is a 65 y.o. female who presents today to establish care. She is requesting labs for sugar and cholesterol. She also has concerns of frequently passing gas.   Hyperlipidemia She is not sure how long she has had high cholesterol for. She thinks she has been on medication x 10 years. She has never smoked. Possibly has prediabetes.   Prediabetes She tells me her doctor told her she was borderline. She has never been on medication.   Osteoporosis She thinks she has been on this since 2018. Her last bone scan was in 2019. She states she was told her bone scan was fine now... I have no records.   She has complaints of increased fluctuance since she moved here. She is just very gassy. She has no stomach pain or bloating. She has no nausea/vomiting. She states her daughter is a vegetarian.     Review of Systems  Constitutional: Negative for chills, fatigue and fever.  HENT: Negative for dental problem, ear pain, hearing loss and trouble swallowing.   Eyes: Negative for visual disturbance.  Respiratory: Negative for cough, chest tightness and shortness of breath.   Cardiovascular: Negative for chest pain, palpitations and leg swelling.  Gastrointestinal: Negative for abdominal pain, blood in stool, diarrhea and nausea.  Endocrine: Negative for cold intolerance, polydipsia, polyphagia and polyuria.  Genitourinary: Negative for dysuria and hematuria.  Musculoskeletal: Negative for arthralgias.  Skin: Negative for rash.  Neurological: Negative for dizziness and headaches.  Psychiatric/Behavioral: Negative for dysphoric mood and sleep disturbance. The patient is not nervous/anxious.     Allergies Patient has No Known Allergies.  Past Medical History Patient  has  a past medical history of Chronic hepatitis B ([place]), Hyperlipemia, Osteopenia, and Vitamin D deficiency.  Surgical History Patient  has a past surgical history that includes Abdominal hysterectomy; Breast lumpectomy; Colonoscopy (2006); flex sigmoidoscopy (2012); Cholecystectomy, laparoscopic; kidney stones; and Cataract extraction.  Family History Pateint's family history includes Breast cancer in her sister; Diabetes in her father; Hypertension in her father and mother; Prostate cancer in her father.  Social History Patient  reports that she has never smoked. She has never used smokeless tobacco. She reports that she does not drink alcohol and does not use drugs.    Objective: Vitals:   05/17/20 1327  BP: 100/62  Pulse: 71  Temp: 98.3 F (36.8 C)  TempSrc: Temporal  SpO2: 98%  Weight: 132 lb 6.4 oz (60.1 kg)  Height: 5\' 2"  (1.575 m)    Body mass index is 24.22 kg/m.  Physical Exam Vitals reviewed.  Constitutional:      Appearance: Normal appearance. She is well-developed, normal weight and well-nourished.  HENT:     Head: Normocephalic and atraumatic.     Right Ear: Tympanic membrane, ear canal and external ear normal.     Left Ear: Tympanic membrane, ear canal and external ear normal.     Mouth/Throat:     Mouth: Oropharynx is clear and moist. Mucous membranes are moist.  Eyes:     Extraocular Movements: Extraocular movements intact and EOM normal.     Conjunctiva/sclera: Conjunctivae normal.     Pupils: Pupils are equal, round, and reactive to light.  Neck:     Thyroid: No thyromegaly.  Cardiovascular:  Rate and Rhythm: Normal rate and regular rhythm.     Pulses: Normal pulses and intact distal pulses.     Heart sounds: Normal heart sounds. No murmur heard.   Pulmonary:     Effort: Pulmonary effort is normal.     Breath sounds: Normal breath sounds.  Abdominal:     General: Abdomen is flat. Bowel sounds are normal. There is no distension.     Palpations:  Abdomen is soft.     Tenderness: There is no abdominal tenderness.  Musculoskeletal:     Cervical back: Normal range of motion and neck supple.  Lymphadenopathy:     Cervical: No cervical adenopathy.  Skin:    General: Skin is warm and dry.     Capillary Refill: Capillary refill takes less than 2 seconds.     Findings: No rash.  Neurological:     General: No focal deficit present.     Mental Status: She is alert and oriented to person, place, and time.     Cranial Nerves: No cranial nerve deficit.     Coordination: Coordination normal.     Deep Tendon Reflexes: Reflexes normal.  Psychiatric:        Mood and Affect: Mood and affect and mood normal.        Behavior: Behavior normal.    [facility] Office Visit from 05/17/2020 in [facility]  PHQ-2 Total Score 0         Assessment/plan: 1. Mixed hyperlipidemia Not fasting today, but she does not drive so will get labs. Refilled her lipitor and will see where she is at. Diet sounds poor. Encouraged her to eat better and exercise. Liver enzymes have been normal with chronic hep B infection.  - CBC with Differential/Platelet - Comprehensive metabolic panel - Lipid panel  2. Prediabetes  - Hemoglobin A1c  3. Age-related osteoporosis without current pathological fracture Has nearly been on fosamax for 5 years. Requesting records to see when this was started.   4. Hepatitis B infection without delta agent without hepatic coma, unspecified chronicity Followed by GI. Requesting records for immunizations.   5. Flatulence FOPMOP diet given. Just had cscope. No other abnormal findings on exam. No new otc meds. Would adjust diet, possibly try probiotic for gut health if it doesn't make the gas worse and can try otc gas medication but I find them to be expensive with limited benefits.   -trial melatonin prn for sleep aid   This visit occurred during the SARS-CoV-2 public health emergency.  Safety protocols  were in place, including screening questions prior to the visit, additional usage of staff PPE, and extensive cleaning of exam room while observing appropriate contact time as indicated for disinfecting solutions.    Return in about 6 months (around 11/14/2020) for prediabetes.   Orma Flaming, MD [facility]   05/17/2020

## 2020-05-17 NOTE — Patient Instructions (Addendum)
-  for sleep could try over the counter melatonin. I like this for sleep.   -checking labs today.   -call me with your dosing for your alendronate.   -I also need your records so I can make sure you are up to date on everything.   So nice to meet you. Will need to see you every 6 months!  Have fun in [place]!  Dr. Rogers Blocker

## 2020-05-20 MED ORDER — ALENDRONATE SODIUM 35 MG PO TABS: 35.0000 mg | ORAL_TABLET | ORAL | 1 refills | Status: DC

## 2020-05-20 NOTE — Telephone Encounter (Signed)
Okay to send refill for Fosamax?

## 2020-05-20 NOTE — Telephone Encounter (Signed)
Medication name is Alendronate. She is requesting a refill and would like it sent to [facility] on [address]

## 2020-05-20 NOTE — Telephone Encounter (Signed)
Sent in refill.  Orma Flaming, MD [facility]

## 2020-06-13 ENCOUNTER — Ambulatory Visit (INDEPENDENT_AMBULATORY_CARE_PROVIDER_SITE_OTHER): Payer: Medicare PPO

## 2020-06-13 DIAGNOSIS — Z0000 Encounter for general adult medical examination without abnormal findings: Secondary | ICD-10-CM | POA: Diagnosis not present

## 2020-06-13 DIAGNOSIS — Z1231 Encounter for screening mammogram for malignant neoplasm of breast: Secondary | ICD-10-CM | POA: Diagnosis not present

## 2020-06-13 DIAGNOSIS — M810 Age-related osteoporosis without current pathological fracture: Secondary | ICD-10-CM | POA: Diagnosis not present

## 2020-06-13 NOTE — Patient Instructions (Addendum)
Wanda Willis , Thank you for taking time to come for your Medicare Wellness Visit. I appreciate your ongoing commitment to your health goals. Please review the following plan we discussed and let me know if I can assist you in the future.   Screening recommendations/referrals: Colonoscopy: Done 02/15/20 Mammogram: Ordered 06/13/20 Bone Density: Ordered 06/13/20 Recommended yearly ophthalmology/optometry visit for glaucoma screening and checkup Recommended yearly dental visit for hygiene and checkup  Vaccinations: Influenza vaccine: Up to date Pneumococcal vaccine: Due and discussed Tdap vaccine: Up to date Shingles vaccine: Completed 08/19/16   Covid-19:Completed 06/17/19, 07/08/19, & 04/30/20  Advanced directives: Advance directive discussed with you today. Even though you declined this today please call our office should you change your mind and we can give you the proper paperwork for you to fill out.  Conditions/risks identified: Lose weight   Next appointment: Follow up in one year for your annual wellness visit    Preventive Care 65 Years and Older, Female Preventive care refers to lifestyle choices and visits with your health care provider that can promote health and wellness. What does preventive care include?  A yearly physical exam. This is also called an annual well check.  Dental exams once or twice a year.  Routine eye exams. Ask your health care provider how often you should have your eyes checked.  Personal lifestyle choices, including:  Daily care of your teeth and gums.  Regular physical activity.  Eating a healthy diet.  Avoiding tobacco and drug use.  Limiting alcohol use.  Practicing safe sex.  Taking low-dose aspirin every day.  Taking vitamin and mineral supplements as recommended by your health care provider. What happens during an annual well check? The services and screenings done by your health care provider during your annual well check will  depend on your age, overall health, lifestyle risk factors, and family history of disease. Counseling  Your health care provider may ask you questions about your:  Alcohol use.  Tobacco use.  Drug use.  Emotional well-being.  Home and relationship well-being.  Sexual activity.  Eating habits.  History of falls.  Memory and ability to understand (cognition).  Work and work Statistician.  Reproductive health. Screening  You may have the following tests or measurements:  Height, weight, and BMI.  Blood pressure.  Lipid and cholesterol levels. These may be checked every 5 years, or more frequently if you are over 5 years old.  Skin check.  Lung cancer screening. You may have this screening every year starting at age 75 if you have a 30-pack-year history of smoking and currently smoke or have quit within the past 15 years.  Fecal occult blood test (FOBT) of the stool. You may have this test every year starting at age 76.  Flexible sigmoidoscopy or colonoscopy. You may have a sigmoidoscopy every 5 years or a colonoscopy every 10 years starting at age 23.  Hepatitis C blood test.  Hepatitis B blood test.  Sexually transmitted disease (STD) testing.  Diabetes screening. This is done by checking your blood sugar (glucose) after you have not eaten for a while (fasting). You may have this done every 1-3 years.  Bone density scan. This is done to screen for osteoporosis. You may have this done starting at age 80.  Mammogram. This may be done every 1-2 years. Talk to your health care provider about how often you should have regular mammograms. Talk with your health care provider about your test results, treatment options, and if necessary,  the need for more tests. Vaccines  Your health care provider may recommend certain vaccines, such as:  Influenza vaccine. This is recommended every year.  Tetanus, diphtheria, and acellular pertussis (Tdap, Td) vaccine. You may need a  Td booster every 10 years.  Zoster vaccine. You may need this after age 69.  Pneumococcal 13-valent conjugate (PCV13) vaccine. One dose is recommended after age 56.  Pneumococcal polysaccharide (PPSV23) vaccine. One dose is recommended after age 3. Talk to your health care provider about which screenings and vaccines you need and how often you need them. This information is not intended to replace advice given to you by your health care provider. Make sure you discuss any questions you have with your health care provider. Document Released: 04/12/2015 Document Revised: 12/04/2015 Document Reviewed: 01/15/2015 Elsevier Interactive Patient Education  2017 [employer] Prevention in the Home Falls can cause injuries. They can happen to people of all ages. There are many things you can do to make your home safe and to help prevent falls. What can I do on the outside of my home?  Regularly fix the edges of walkways and driveways and fix any cracks.  Remove anything that might make you trip as you walk through a door, such as a raised step or threshold.  Trim any bushes or trees on the path to your home.  Use bright outdoor lighting.  Clear any walking paths of anything that might make someone trip, such as rocks or tools.  Regularly check to see if handrails are loose or broken. Make sure that both sides of any steps have handrails.  Any raised decks and porches should have guardrails on the edges.  Have any leaves, snow, or ice cleared regularly.  Use sand or salt on walking paths during winter.  Clean up any spills in your garage right away. This includes oil or grease spills. What can I do in the bathroom?  Use night lights.  Install grab bars by the toilet and in the tub and shower. Do not use towel bars as grab bars.  Use non-skid mats or decals in the tub or shower.  If you need to sit down in the shower, use a plastic, non-slip stool.  Keep the floor dry. Clean  up any water that spills on the floor as soon as it happens.  Remove soap buildup in the tub or shower regularly.  Attach bath mats securely with double-sided non-slip rug tape.  Do not have throw rugs and other things on the floor that can make you trip. What can I do in the bedroom?  Use night lights.  Make sure that you have a light by your bed that is easy to reach.  Do not use any sheets or blankets that are too big for your bed. They should not hang down onto the floor.  Have a firm chair that has side arms. You can use this for support while you get dressed.  Do not have throw rugs and other things on the floor that can make you trip. What can I do in the kitchen?  Clean up any spills right away.  Avoid walking on wet floors.  Keep items that you use a lot in easy-to-reach places.  If you need to reach something above you, use a strong step stool that has a grab bar.  Keep electrical cords out of the way.  Do not use floor polish or wax that makes floors slippery. If you must use  wax, use non-skid floor wax.  Do not have throw rugs and other things on the floor that can make you trip. What can I do with my stairs?  Do not leave any items on the stairs.  Make sure that there are handrails on both sides of the stairs and use them. Fix handrails that are broken or loose. Make sure that handrails are as long as the stairways.  Check any carpeting to make sure that it is firmly attached to the stairs. Fix any carpet that is loose or worn.  Avoid having throw rugs at the top or bottom of the stairs. If you do have throw rugs, attach them to the floor with carpet tape.  Make sure that you have a light switch at the top of the stairs and the bottom of the stairs. If you do not have them, ask someone to add them for you. What else can I do to help prevent falls?  Wear shoes that:  Do not have high heels.  Have rubber bottoms.  Are comfortable and fit you well.  Are  closed at the toe. Do not wear sandals.  If you use a stepladder:  Make sure that it is fully opened. Do not climb a closed stepladder.  Make sure that both sides of the stepladder are locked into place.  Ask someone to hold it for you, if possible.  Clearly mark and make sure that you can see:  Any grab bars or handrails.  First and last steps.  Where the edge of each step is.  Use tools that help you move around (mobility aids) if they are needed. These include:  Canes.  Walkers.  Scooters.  Crutches.  Turn on the lights when you go into a dark area. Replace any light bulbs as soon as they burn out.  Set up your furniture so you have a clear path. Avoid moving your furniture around.  If any of your floors are uneven, fix them.  If there are any pets around you, be aware of where they are.  Review your medicines with your doctor. Some medicines can make you feel dizzy. This can increase your chance of falling. Ask your doctor what other things that you can do to help prevent falls. This information is not intended to replace advice given to you by your health care provider. Make sure you discuss any questions you have with your health care provider. Document Released: 01/10/2009 Document Revised: 08/22/2015 Document Reviewed: 04/20/2014 Elsevier Interactive Patient Education  2017 [employer].

## 2020-06-13 NOTE — Progress Notes (Signed)
Virtual Visit via Telephone Note  I connected with  Wanda Willis on 06/13/20 at 11:45 AM EDT by telephone and verified that I am speaking with the correct person using two identifiers.  Medicare Annual Wellness visit completed telephonically due to Covid-19 pandemic.   Persons participating in this call: This Health Coach and this patient.   Location: Patient: Home Provider: Office   I discussed the limitations, risks, security and privacy concerns of performing an evaluation and management service by telephone and the availability of in person appointments. The patient expressed understanding and agreed to proceed.  Unable to perform video visit due to video visit attempted and failed and/or patient does not have video capability.   Some vital signs may be absent or patient reported.   Willette Brace, LPN    Subjective:   Wanda Willis is a 65 y.o. female who presents for an Initial Medicare Annual Wellness Visit.  Review of Systems     Cardiac Risk Factors include: advanced age (>60men, >43 women);dyslipidemia     Objective:    There were no vitals filed for this visit. There is no height or weight on file to calculate BMI.  Advanced Directives 06/13/2020  Does Patient Have a Medical Advance Directive? No  Would patient like information on creating a medical advance directive? No - Patient declined    Current Medications (verified) Outpatient Encounter Medications as of 06/13/2020  Medication Sig  . alendronate (FOSAMAX) 35 MG tablet Take 1 tablet (35 mg total) by mouth every 7 (seven) days. Take with a full glass of water on an empty stomach.  Marland Kitchen atorvastatin (LIPITOR) 10 MG tablet Take 1 tablet (10 mg total) by mouth daily.  . Cholecalciferol (VITAMIN D3) 25 MCG (1000 UT) CAPS Take 1 capsule by mouth daily.  . Cinnamon 500 MG TABS Take 2 tablets by mouth in the morning and at bedtime.  . Omega-3 Fatty Acids (FISH OIL) 1000 MG CAPS Take by mouth in the morning and at  bedtime.  Marland Kitchen QUERCETIN PO Take 500 mg by mouth daily.  . Zinc Sulfate (ZINC 15) 66 MG TABS Take by mouth.  . Ascorbic Acid (VITAMIN C WITH ROSE HIPS) 500 MG tablet Take 500 mg by mouth daily. (Patient not taking: No sig reported)  . Multiple Vitamin (MULTIVITAMIN) tablet Take 1 tablet by mouth daily. (Patient not taking: No sig reported)   No facility-administered encounter medications on file as of 06/13/2020.    Allergies (verified) Patient has no known allergies.   History: Past Medical History:  Diagnosis Date  . Chronic hepatitis B ([place])   . Hyperlipemia   . Osteopenia   . Vitamin D deficiency    Past Surgical History:  Procedure Laterality Date  . ABDOMINAL HYSTERECTOMY    . BREAST LUMPECTOMY    . CATARACT EXTRACTION    . CHOLECYSTECTOMY, LAPAROSCOPIC    . COLONOSCOPY  2006  . flex sigmoidoscopy  2012  . kidney stones     Family History  Problem Relation Age of Onset  . Hypertension Mother   . Diabetes Father   . Hypertension Father   . Prostate cancer Father   . Breast cancer Sister    Social History   Socioeconomic History  . Marital status: Married    Spouse name: Not on file  . Number of children: Not on file  . Years of education: Not on file  . Highest education level: Not on file  Occupational History  . Not on  file  Tobacco Use  . Smoking status: Never Smoker  . Smokeless tobacco: Never Used  Vaping Use  . Vaping Use: Never used  Substance and Sexual Activity  . Alcohol use: Never  . Drug use: Never  . Sexual activity: Not on file  Other Topics Concern  . Not on file  Social History Narrative  . Not on file   Social Determinants of Health   Financial Resource Strain: Low Risk   . Difficulty of Paying Living Expenses: Not hard at all  Food Insecurity: No Food Insecurity  . Worried About Charity fundraiser in the Last Year: Never true  . Ran Out of Food in the Last Year: Never true  Transportation Needs: No Transportation Needs  . Lack  of Transportation (Medical): No  . Lack of Transportation (Non-Medical): No  Physical Activity: Sufficiently Active  . Days of Exercise per Week: 5 days  . Minutes of Exercise per Session: 30 min  Stress: No Stress Concern Present  . Feeling of Stress : Not at all  Social Connections: Moderately Integrated  . Frequency of Communication with Friends and Family: More than three times a week  . Frequency of Social Gatherings with Friends and Family: Once a week  . Attends Religious Services: 1 to 4 times per year  . Active Member of Clubs or Organizations: No  . Attends Archivist Meetings: Never  . Marital Status: Married    Tobacco Counseling Counseling given: Not Answered   Clinical Intake:  Pre-visit preparation completed: Yes  Pain : No/denies pain     BMI - recorded: 24.22 Nutritional Status: BMI of 19-24  Normal Nutritional Risks: None Diabetes: No  How often do you need to have someone help you when you read instructions, pamphlets, or other written materials from your doctor or pharmacy?: 1 - Never  Diabetic?No  Interpreter Needed?: No  Information entered by :: Charlott Rakes, LPN   Activities of Daily Living In your present state of health, do you have any difficulty performing the following activities: 06/13/2020  Hearing? N  Vision? N  Difficulty concentrating or making decisions? N  Walking or climbing stairs? N  Dressing or bathing? N  Doing errands, shopping? N  Preparing Food and eating ? N  Using the Toilet? N  In the past six months, have you accidently leaked urine? N  Do you have problems with loss of bowel control? N  Managing your Medications? N  Managing your Finances? N  Housekeeping or managing your Housekeeping? N    Patient Care Team: Orma Flaming, MD as PCP - General (Family Medicine) Armbruster, Carlota Raspberry, MD as Consulting Physician (Gastroenterology)  Indicate any recent Medical Services you may have received from  other than [facility] providers in the past year (date may be approximate).     Assessment:   This is a routine wellness examination for [employer].  Hearing/Vision screen  Hearing Screening   125Hz  250Hz  500Hz  1000Hz  2000Hz  3000Hz  4000Hz  6000Hz  8000Hz   Right ear:           Left ear:           Comments: Pt denies any hearing issues   Vision Screening Comments: Follows up with eye dr in [place] issues and exercise activities discussed: Current Exercise Habits: Home exercise routine, Type of exercise: walking, Time (Minutes): 30, Frequency (Times/Week): 5, Weekly Exercise (Minutes/Week): 150  Goals    . Patient Stated     Lose weight  Depression Screen PHQ 2/9 Scores 06/13/2020 05/17/2020  PHQ - 2 Score 0 0    Fall Risk Fall Risk  06/13/2020 05/17/2020  Falls in the past year? 0 0  Number falls in past yr: 0 -  Injury with Fall? 0 -  Risk for fall due to : Impaired vision No Fall Risks  Follow up Falls prevention discussed -    FALL RISK PREVENTION PERTAINING TO THE HOME:  Any stairs in or around the home? Yes  If so, are there any without handrails? No  Home free of loose throw rugs in walkways, pet beds, electrical cords, etc? Yes  Adequate lighting in your home to reduce risk of falls? Yes   ASSISTIVE DEVICES UTILIZED TO PREVENT FALLS:  Life alert? No  Use of a cane, walker or w/c? No  Grab bars in the bathroom? No  Shower chair or bench in shower? No  Elevated toilet seat or a handicapped toilet? No   TIMED UP AND GO:  Was the test performed? No     Cognitive Function:     6CIT Screen 06/13/2020  What Year? 0 points  What month? 0 points  Count back from 20 0 points  Months in reverse 0 points  Repeat phrase 4 points    Immunizations Immunization History  Administered Date(s) Administered  . Hepatitis B, adult 09/14/2016  . Influenza,inj,Quad PF,6+ Mos 04/30/2017, 04/12/2018, 02/06/2020  . PFIZER(Purple Top)SARS-COV-2 Vaccination 06/17/2019,  07/08/2019, 04/30/2020  . Tdap 08/19/2016  . Zoster Recombinat (Shingrix) 08/19/2016    TDAP status: Up to date  Flu Vaccine status: Up to date  Pneumococcal vaccine status: Due, Education has been provided regarding the importance of this vaccine. Advised may receive this vaccine at local pharmacy or Health Dept. Aware to provide a copy of the vaccination record if obtained from local pharmacy or Health Dept. Verbalized acceptance and understanding.  Covid-19 vaccine status: Completed vaccines  Qualifies for Shingles Vaccine? Yes   Zostavax completed Yes   Shingrix Completed?: Yes  Screening Tests Health Maintenance  Topic Date Due  . Hepatitis C Screening  Never done  . MAMMOGRAM  Never done  . DEXA SCAN  Never done  . PNA vac Low Risk Adult (1 of 2 - PCV13) Never done  . TETANUS/TDAP  08/20/2026  . COLONOSCOPY (Pts 45-79yrs Insurance coverage will need to be confirmed)  02/14/2030  . INFLUENZA VACCINE  Completed  . COVID-19 Vaccine  Completed  . HPV VACCINES  Aged Out    Health Maintenance  Health Maintenance Due  Topic Date Due  . Hepatitis C Screening  Never done  . MAMMOGRAM  Never done  . DEXA SCAN  Never done  . PNA vac Low Risk Adult (1 of 2 - PCV13) Never done    Colorectal cancer screening: Type of screening: Colonoscopy. Completed 02/15/20. Repeat every 10 years  Mammogram status: Ordered 06/13/20. Pt provided with contact info and advised to call to schedule appt.   Bone Density status: Ordered 06/13/20. Pt provided with contact info and advised to call to schedule appt.   Additional Screening:  Hepatitis C Screening: does qualify  Vision Screening: Recommended annual ophthalmology exams for early detection of glaucoma and other disorders of the eye. Is the patient up to date with their annual eye exam?  No  Who is the provider or what is the name of the office in which the patient attends annual eye exams? Follows up mas needed  With provider in  [place]  If pt is not established with a provider, would they like to be referred to a provider to establish care? No .   Dental Screening: Recommended annual dental exams for proper oral hygiene  Community Resource Referral / Chronic Care Management: CRR required this visit?  No   CCM required this visit?  No      Plan:     I have personally reviewed and noted the following in the patient's chart:   . Medical and social history . Use of alcohol, tobacco or illicit drugs  . Current medications and supplements . Functional ability and status . Nutritional status . Physical activity . Advanced directives . List of other physicians . Hospitalizations, surgeries, and ER visits in previous 12 months . Vitals . Screenings to include cognitive, depression, and falls . Referrals and appointments  In addition, I have reviewed and discussed with patient certain preventive protocols, quality metrics, and best practice recommendations. A written personalized care plan for preventive services as well as general preventive health recommendations were provided to patient.     Willette Brace, LPN   09/09/2447   Nurse Notes: None

## 2020-08-12 ENCOUNTER — Telehealth: Payer: Self-pay

## 2020-08-12 NOTE — Telephone Encounter (Signed)
MEDICATION: atorvastatin (LIPITOR) 10 MG tablet  alendronate (FOSAMAX) 35 MG tablet   PHARMACY: [facility] [place], [place] - [address] AT [place]  Comments: Patient is out of town and would like this refilled      **Let patient know to contact pharmacy at the end of the day to make sure medication is ready. **  ** Please notify patient to allow 48-72 hours to process**  **Encourage patient to contact the pharmacy for refills or they can request refills through [facility]**

## 2020-08-12 NOTE — Telephone Encounter (Signed)
I spoke the pt to address refills below. I was not able to understand her requests.

## 2020-08-14 NOTE — Telephone Encounter (Signed)
Junious Dresser can you call her and see what she is needing please? Typically can't send drugs across statelines unless temporary supply.  Orma Flaming, MD [place]

## 2020-08-14 NOTE — Telephone Encounter (Signed)
LVM to return call.

## 2020-08-16 ENCOUNTER — Ambulatory Visit: Payer: Medicare PPO

## 2020-08-16 NOTE — Telephone Encounter (Signed)
Patient stated Rx was fixed, does not need refills at this time

## 2020-08-27 ENCOUNTER — Ambulatory Visit: Payer: Medicare PPO

## 2020-08-28 ENCOUNTER — Telehealth: Payer: Self-pay

## 2020-08-28 DIAGNOSIS — B181 Chronic viral hepatitis B without delta-agent: Secondary | ICD-10-CM

## 2020-08-28 NOTE — Telephone Encounter (Signed)
Spoke with patient, she has been advised that she is due for repeat lab work at this time. Patient will come in on Monday for repeat lab work because she is out of town at this time. Patient is aware that no appt is necessary for lab work and she can stop by at her convenience between 7:30 am - 5 pm. Advised that we have also ordered a repeat US, she is aware that radiology scheduling will contact her directly to schedule [place] appt. Provided patient with the radiology scheduling number in case she does not hear from them within a week. Patient is already scheduled for a follow up with Dr. Havery Moros on 09/04/20 at 9 am. Patient verbalized understanding of all information and had no concerns at the end of the call.  Staff message sent to radiology schedulers (April Pait and Rhys Martini) to schedule patient for [place].

## 2020-08-28 NOTE — Telephone Encounter (Signed)
-----   Message from Marlon Pel, RN sent at 02/28/2020 11:52 AM EST ----- Patient needs labs- entered in Epic- see results 02/28/2020 Armbruster Needs a [place] see results on Elastography from 12/1 Needs 6 month OV- I put in OV recall

## 2020-09-02 ENCOUNTER — Other Ambulatory Visit (INDEPENDENT_AMBULATORY_CARE_PROVIDER_SITE_OTHER): Payer: Medicare PPO

## 2020-09-02 DIAGNOSIS — B181 Chronic viral hepatitis B without delta-agent: Secondary | ICD-10-CM

## 2020-09-02 LAB — HEPATIC FUNCTION PANEL
ALT: 29 U/L (ref 0–35)
AST: 24 U/L (ref 0–37)
Albumin: 4.4 g/dL (ref 3.5–5.2)
Alkaline Phosphatase: 46 U/L (ref 39–117)
Bilirubin, Direct: 0.2 mg/dL (ref 0.0–0.3)
Total Bilirubin: 1.2 mg/dL (ref 0.2–1.2)
Total Protein: 7.7 g/dL (ref 6.0–8.3)

## 2020-09-03 ENCOUNTER — Ambulatory Visit (HOSPITAL_BASED_OUTPATIENT_CLINIC_OR_DEPARTMENT_OTHER)
Admission: RE | Admit: 2020-09-03 | Discharge: 2020-09-03 | Disposition: A | Discharge status: Home / Self Care | Payer: Medicare PPO | Source: Ambulatory Visit | Attending: Gastroenterology | Admitting: Gastroenterology

## 2020-09-03 ENCOUNTER — Ambulatory Visit (HOSPITAL_BASED_OUTPATIENT_CLINIC_OR_DEPARTMENT_OTHER): Admission: RE | Admit: 2020-09-03 | Payer: Medicare PPO | Source: Ambulatory Visit

## 2020-09-03 ENCOUNTER — Other Ambulatory Visit: Payer: Self-pay

## 2020-09-03 DIAGNOSIS — B181 Chronic viral hepatitis B without delta-agent: Secondary | ICD-10-CM | POA: Insufficient documentation

## 2020-09-04 ENCOUNTER — Encounter: Payer: Self-pay | Admitting: Gastroenterology

## 2020-09-04 ENCOUNTER — Ambulatory Visit (INDEPENDENT_AMBULATORY_CARE_PROVIDER_SITE_OTHER): Payer: Medicare PPO | Admitting: Gastroenterology

## 2020-09-04 VITALS — BP 124/78 | HR 65 | Ht 62.0 in | Wt 131.4 lb

## 2020-09-04 DIAGNOSIS — B181 Chronic viral hepatitis B without delta-agent: Secondary | ICD-10-CM

## 2020-09-04 LAB — HEPATITIS B DNA, ULTRAQUANTITATIVE, PCR
Hepatitis B DNA (Calc): 4.49 Log IU/mL — ABNORMAL HIGH
Hepatitis B DNA: 31100 IU/mL — ABNORMAL HIGH

## 2020-09-04 LAB — AFP TUMOR MARKER: AFP-Tumor Marker: 2.7 ng/mL

## 2020-09-04 NOTE — Progress Notes (Signed)
HPI :  65 year old female here for follow-up visit for chronic hepatitis B.  Please see last note for full details of her history.  She previously was followed in [place] for the past few years.  She does not know when she was diagnosed with chronic hepatitis B or how long she has had it.  She is unclear of any family history of hepatitis B but thinks her uncle did have liver cancer.  She was born in the [place], denies any alcohol use.  Has tested negative for hepatitis C.  In short she has hepatitis B E antigen negative disease with viral load ranging from 8-20,000.  She historically has been monitored for this as her LFTs have remained normal.  She had an elastography with me in November showing low risk study for fibrosis.  Her LFTs have been normal's, specifically ALT has been in the 20s. Last viral load showed DNA level of 8520.  We elected to monitor and repeat labs again in 6 months with another ultrasound for [facility] screening.  She had her ultrasound done yesterday but official report is pending.  She also had another hep B DNA level drawn on 6 June, that level remains pending.  Her LFTs show an ALT of 29, AFP level remains normal.  Is otherwise feeling well without any complaints.  She is quite active and has been doing a lot of traveling.  She inquires about how she can reduce her husband's risk for getting this, he has been tested in the past and is negative.  She states her daughter had some questions about this in general and called her during the office visit today and we had a lengthy discussion about her case and options moving forward.   Of note she had a colonoscopy with me this past November for screening purposes, no precancerous polyps removed.  Records reviewed: 02/10/19 - fibroscan normal liver Hep A total AB positive Hep C negative Iron sat 32%, ANA negative (+) hep B  05/17/19 - hep B surface antigen (+), hep B core AB (+), Hep B e AG (-), HBV DNA 11,400  09/15/19  - HBV DNA 29,700, LFTs normal - ALT 31, AST 28, AFP 2.8  09/01/19 - RUQ [place] - coarsened liver echotexture, no solid mass lesions - results c/w hepatic steatosis or hepatic dysfunction  Colonoscopy 02/15/20 - The perianal and digital rectal examinations were normal. - A suspected 5 to 6 mm polyp vs. normal varient was found in the appendiceal orifice. The polyp was flat. Margins were a bit hard to delineate. I don't think obviously involving down into the lumen of the appendix but was difficult to visualize in that area. The polyp was removed with a cold snare. Resection and retrieval were thought to have been complete. - A 3 mm polyp was found in the sigmoid colon. The polyp was sessile. The polyp was removed with a cold snare. Resection and retrieval were complete. - Internal hemorrhoids were found during retroflexion. The hemorrhoids were small. - The exam was otherwise without abnormality.  Benign pathology, repeat colonoscopy in 10 years  [place] elastography 02/27/20 - IMPRESSION: 1. ULTRASOUND LIVER: Unremarkable sonographic appearance of the liver. 2. ULTRASOUND HEPATIC ELASTOGRAPHY: Median kPa: 3.5. Diagnostic category: < or 5 kPa: high probability of being normal.   Labs 02/06/20: Hep B E AB (+) Hep B E AG (-) Hep B DNA 8520 ALT 25, AST 22 AFP 3.2  Labs 09/02/20: ALT 29, AST 24, T bil 1.2, AP 46  AFP 2.7 DNA pending  RUQ [place] 09/03/20 - pending     Past Medical History:  Diagnosis Date  . Chronic hepatitis B ([place])   . Hyperlipemia   . Osteopenia   . Vitamin D deficiency      Past Surgical History:  Procedure Laterality Date  . ABDOMINAL HYSTERECTOMY    . BREAST LUMPECTOMY    . CATARACT EXTRACTION    . CHOLECYSTECTOMY, LAPAROSCOPIC    . COLONOSCOPY  2006  . flex sigmoidoscopy  2012  . kidney stones     Family History  Problem Relation Age of Onset  . Hypertension Mother   . Diabetes Father   . Hypertension Father   . Prostate cancer Father   . Breast cancer  Sister    Social History   Tobacco Use  . Smoking status: Never Smoker  . Smokeless tobacco: Never Used  Vaping Use  . Vaping Use: Never used  Substance Use Topics  . Alcohol use: Never  . Drug use: Never   Current Outpatient Medications  Medication Sig Dispense Refill  . alendronate (FOSAMAX) 35 MG tablet Take 1 tablet (35 mg total) by mouth every 7 (seven) days. Take with a full glass of water on an empty stomach. 12 tablet 1  . Ascorbic Acid (VITAMIN C WITH ROSE HIPS) 500 MG tablet Take 500 mg by mouth daily.    Marland Kitchen atorvastatin (LIPITOR) 10 MG tablet Take 1 tablet (10 mg total) by mouth daily. 90 tablet 3  . Cholecalciferol (VITAMIN D3) 25 MCG (1000 UT) CAPS Take 1 capsule by mouth daily.    . Cinnamon 500 MG TABS Take 2 tablets by mouth in the morning and at bedtime.    . Multiple Vitamin (MULTIVITAMIN) tablet Take 1 tablet by mouth daily.    . Omega-3 Fatty Acids (FISH OIL) 1000 MG CAPS Take by mouth in the morning and at bedtime.    Marland Kitchen QUERCETIN PO Take 500 mg by mouth daily.    . Zinc Sulfate (ZINC 15) 66 MG TABS Take by mouth.     No current facility-administered medications for this visit.   No Known Allergies   Review of Systems: All systems reviewed and negative except where noted in HPI.   Lab Results  Component Value Date   ALT 29 09/02/2020   AST 24 09/02/2020   ALKPHOS 46 09/02/2020   BILITOT 1.2 09/02/2020    Lab Results  Component Value Date   CREATININE 0.75 05/17/2020   BUN 10 05/17/2020   NA 139 05/17/2020   K 4.6 05/17/2020   CL 103 05/17/2020   CO2 30 05/17/2020    Lab Results  Component Value Date   ALT 29 09/02/2020   AST 24 09/02/2020   ALKPHOS 46 09/02/2020   BILITOT 1.2 09/02/2020     Physical Exam: BP 124/78   Pulse 65   Ht 5\' 2"  (1.575 m)   Wt 131 lb 6.4 oz (59.6 kg)   SpO2 99%   BMI 24.03 kg/m  Constitutional: Pleasant,well-developed, female in no acute distress. Neurological: Alert and oriented to person place and  time. Psychiatric: Normal mood and affect. Behavior is normal.   ASSESSMENT AND PLAN: 65 year old female here for reassessment of the following:  Chronic hepatitis B  History as outlined above.  She has had this for at least a few years now and has been monitored without therapy.  Her viral load is elevated however her ALT has remained normal.  She has had ultrasound with  elastography in November which showed no significant fibrotic change.  Her AFP is normal.  We reviewed AAS LD practice guidelines for chronic hepatitis B, she appears to be tolerating it well without any significant liver inflammation.  We discussed the role of liver biopsy, I think unlikely to show any significant inflammation given her liver enzymes and normal elastography.  At this time I am waiting her follow-up DNA level as well as ultrasound for [facility] screening and we will get that back to her.  Assuming these continue to look stable/okay, we will plan on continued ultrasound every 6 months for [facility] screening and continued observation based off her LFTs.  During the visit the patient put her daughter on the phone and I answered all of her questions and explained the situation with her as well.  They are comfortable with monitoring for now.  He understands there is a risk for cirrhosis with hep B although hopefully that is unlikely given the information we have to date.  She understands she is at higher risk for [facility] and will need screening every 6 months for this moving forward.  I recommended that of her husband has not already done so, he obtain the hepatitis B vaccine.  She thinks he has already gotten that with his military service, but should have an antibody titer sent to make sure he remains immune.  Assuming labs and imaging are stable we will plan on repeating things again in 6 months.  She can contact me in the interim with any questions or concerns.  [place] Cellar, MD [facility]  I spent 35 minutes of  time, including in depth chart review, independent review of results as outlined above, , face-to-face time with the patient, discussion of her case with the patient's daughter during the visit, and documentation.

## 2020-09-04 NOTE — Patient Instructions (Signed)
If you are age 67 or older, your body mass index should be between 23-30. Your Body mass index is 24.03 kg/m. If this is out of the aforementioned range listed, please consider follow up with your Primary Care Provider.  If you are age 11 or younger, your body mass index should be between 19-25. Your Body mass index is 24.03 kg/m. If this is out of the aformentioned range listed, please consider follow up with your Primary Care Provider.   __________________________________________________________  The  GI providers would like to encourage you to use [facility] to communicate with providers for non-urgent requests or questions.  Due to long hold times on the telephone, sending your provider a message by [facility] may be a faster and more efficient way to get a response.  Please allow 48 business hours for a response.  Please remember that this is for non-urgent requests.   You will be due for a recall Ultrasound and lab work in November 2022. We will send you a reminder in the mail when it gets closer to that time.  It was a pleasure to see you today!  Thank you for trusting me with your gastrointestinal care!

## 2020-09-06 ENCOUNTER — Other Ambulatory Visit: Payer: Self-pay

## 2020-09-06 DIAGNOSIS — B181 Chronic viral hepatitis B without delta-agent: Secondary | ICD-10-CM

## 2020-11-05 ENCOUNTER — Other Ambulatory Visit: Payer: Self-pay | Admitting: Family Medicine

## 2020-11-05 ENCOUNTER — Other Ambulatory Visit: Payer: Self-pay

## 2020-11-15 ENCOUNTER — Ambulatory Visit: Payer: Medicare PPO | Admitting: Family Medicine

## 2020-12-06 ENCOUNTER — Ambulatory Visit
Admission: RE | Admit: 2020-12-06 | Discharge: 2020-12-06 | Disposition: A | Discharge status: Home / Self Care | Payer: Medicare PPO | Source: Ambulatory Visit | Attending: Family Medicine | Admitting: Family Medicine

## 2020-12-06 ENCOUNTER — Other Ambulatory Visit: Payer: Self-pay

## 2020-12-06 DIAGNOSIS — Z1231 Encounter for screening mammogram for malignant neoplasm of breast: Secondary | ICD-10-CM

## 2020-12-06 DIAGNOSIS — M810 Age-related osteoporosis without current pathological fracture: Secondary | ICD-10-CM

## 2020-12-17 ENCOUNTER — Other Ambulatory Visit: Payer: Self-pay | Admitting: Family Medicine

## 2020-12-17 DIAGNOSIS — R928 Other abnormal and inconclusive findings on diagnostic imaging of breast: Secondary | ICD-10-CM

## 2021-01-02 ENCOUNTER — Other Ambulatory Visit: Payer: Medicare PPO

## 2021-01-06 ENCOUNTER — Ambulatory Visit
Admission: RE | Admit: 2021-01-06 | Discharge: 2021-01-06 | Disposition: A | Discharge status: Home / Self Care | Payer: Medicare PPO | Source: Ambulatory Visit | Attending: Family Medicine | Admitting: Family Medicine

## 2021-01-06 ENCOUNTER — Other Ambulatory Visit: Payer: Self-pay

## 2021-01-06 DIAGNOSIS — R928 Other abnormal and inconclusive findings on diagnostic imaging of breast: Secondary | ICD-10-CM

## 2021-02-06 ENCOUNTER — Encounter: Payer: Self-pay | Admitting: Internal Medicine

## 2021-02-06 ENCOUNTER — Ambulatory Visit (INDEPENDENT_AMBULATORY_CARE_PROVIDER_SITE_OTHER): Payer: Medicare PPO | Admitting: Internal Medicine

## 2021-02-06 ENCOUNTER — Other Ambulatory Visit: Payer: Self-pay

## 2021-02-06 VITALS — BP 116/72 | HR 68 | Resp 18 | Ht 62.0 in | Wt 134.2 lb

## 2021-02-06 DIAGNOSIS — Z23 Encounter for immunization: Secondary | ICD-10-CM | POA: Diagnosis not present

## 2021-02-06 DIAGNOSIS — Z0000 Encounter for general adult medical examination without abnormal findings: Secondary | ICD-10-CM | POA: Diagnosis not present

## 2021-02-06 DIAGNOSIS — R7303 Prediabetes: Secondary | ICD-10-CM | POA: Diagnosis not present

## 2021-02-06 DIAGNOSIS — M810 Age-related osteoporosis without current pathological fracture: Secondary | ICD-10-CM | POA: Diagnosis not present

## 2021-02-06 DIAGNOSIS — E782 Mixed hyperlipidemia: Secondary | ICD-10-CM

## 2021-02-06 DIAGNOSIS — B1910 Unspecified viral hepatitis B without hepatic coma: Secondary | ICD-10-CM

## 2021-02-06 DIAGNOSIS — Z0001 Encounter for general adult medical examination with abnormal findings: Secondary | ICD-10-CM

## 2021-02-06 LAB — CBC
HCT: 40.7 % (ref 36.0–46.0)
Hemoglobin: 13.4 g/dL (ref 12.0–15.0)
MCHC: 32.9 g/dL (ref 30.0–36.0)
MCV: 90.5 fl (ref 78.0–100.0)
Platelets: 264 10*3/uL (ref 150.0–400.0)
RBC: 4.5 Mil/uL (ref 3.87–5.11)
RDW: 13.4 % (ref 11.5–15.5)
WBC: 5.1 10*3/uL (ref 4.0–10.5)

## 2021-02-06 LAB — LIPID PANEL
Cholesterol: 199 mg/dL (ref 0–200)
HDL: 51.3 mg/dL (ref >39.00)
NonHDL: 147.74
Total CHOL/HDL Ratio: 4
Triglycerides: 285 mg/dL — ABNORMAL HIGH (ref 0.0–149.0)
VLDL: 57 mg/dL — ABNORMAL HIGH (ref 0.0–40.0)

## 2021-02-06 LAB — HEMOGLOBIN A1C: Hgb A1c MFr Bld: 6 % (ref 4.6–6.5)

## 2021-02-06 LAB — LDL CHOLESTEROL, DIRECT: Direct LDL: 106 mg/dL

## 2021-02-06 NOTE — Patient Instructions (Addendum)
We will check the labs today.  We will have you stop fosamax (alendronate) and we will recheck the bone density in about 2 years.

## 2021-02-06 NOTE — Progress Notes (Signed)
   Subjective:   Patient ID: Wanda Willis, female    DOB: 1955/08/05, 65 y.o.   MRN: [id number]  HPI The patient is a 65 YO female coming in for transfer of care with several concerns.  Also wants physical.  PMH, [place], social history reviewed and updated  Review of Systems  Constitutional: Negative.   HENT: Negative.    Eyes: Negative.   Respiratory:  Negative for cough, chest tightness and shortness of breath.   Cardiovascular:  Negative for chest pain, palpitations and leg swelling.  Gastrointestinal:  Negative for abdominal distention, abdominal pain, constipation, diarrhea, nausea and vomiting.  Musculoskeletal:  Positive for arthralgias.  Skin: Negative.   Neurological: Negative.   Psychiatric/Behavioral: Negative.     Objective:  Physical Exam Constitutional:      Appearance: She is well-developed.  HENT:     Head: Normocephalic and atraumatic.  Cardiovascular:     Rate and Rhythm: Normal rate and regular rhythm.  Pulmonary:     Effort: Pulmonary effort is normal. No respiratory distress.     Breath sounds: Normal breath sounds. No wheezing or rales.  Abdominal:     General: Bowel sounds are normal. There is no distension.     Palpations: Abdomen is soft.     Tenderness: There is no abdominal tenderness. There is no rebound.  Musculoskeletal:        General: Tenderness present.     Cervical back: Normal range of motion.  Skin:    General: Skin is warm and dry.  Neurological:     Mental Status: She is alert and oriented to person, place, and time.     Coordination: Coordination normal.    Vitals:   02/06/21 0959  BP: 116/72  Pulse: 68  Resp: 18  SpO2: 98%  Weight: 134 lb 3.2 oz (60.9 kg)  Height: 5\' 2"  (1.575 m)    This visit occurred during the SARS-CoV-2 public health emergency.  Safety protocols were in place, including screening questions prior to the visit, additional usage of staff PPE, and extensive cleaning of exam room while observing appropriate  contact time as indicated for disinfecting solutions.   Assessment & Plan:  Flu shot given at visit

## 2021-02-07 ENCOUNTER — Telehealth: Payer: Self-pay | Admitting: Internal Medicine

## 2021-02-07 DIAGNOSIS — Z0001 Encounter for general adult medical examination with abnormal findings: Secondary | ICD-10-CM | POA: Insufficient documentation

## 2021-02-07 NOTE — Assessment & Plan Note (Signed)
Needs follow up HgA1c which was ordered today and discussed etiology of this and dietary recommendations and exercise recommendations.

## 2021-02-07 NOTE — Assessment & Plan Note (Signed)
Flu shot counseled. Covid-19 booster counseled. Pneumonia counseled. Shingrix counseled. Tetanus up to date. Colonoscopy up to date. Mammogram up to date, pap smear aged out. Counseled about sun safety and mole surveillance. Counseled about the dangers of distracted driving. Given 10 year screening recommendations.

## 2021-02-07 NOTE — Assessment & Plan Note (Signed)
Chronic and gets monitoring every 6 months with LFTs and quantitative levels and [place] and AFP screening for HCC. Seeing GI. Checking CMP today.

## 2021-02-07 NOTE — Telephone Encounter (Signed)
Pt. Is requesting callback to go over lab results from appointment on 11.10.2022.   Please advise.  Callback #- [phone]

## 2021-02-07 NOTE — Assessment & Plan Note (Signed)
She has been on fosamax about 5 years to her recollection and wonders how long she should be on this. We decided through shared decision making that she will stop fosamax and we will repeat DEXA in about 2 years. Last DEXA numbers in osteopenia range this year.

## 2021-02-07 NOTE — Assessment & Plan Note (Signed)
She was switched from 20 mg lipitor to 10 mg lipitor for possible muscle cramps but these have not improved. She would be willing to resume 20 mg daily if lipids not at goal. Checking lipid panel today.

## 2021-02-10 NOTE — Telephone Encounter (Signed)
Result note done.

## 2021-02-11 MED ORDER — ATORVASTATIN CALCIUM 20 MG PO TABS
20.0000 mg | ORAL_TABLET | Freq: Every day | ORAL | 3 refills | Status: DC
Start: 2021-02-11 — End: 2022-01-20

## 2021-02-11 NOTE — Addendum Note (Signed)
Addended by: Pricilla Holm A on: 02/11/2021 11:04 AM   Modules accepted: Orders

## 2021-02-11 NOTE — Telephone Encounter (Signed)
Sent in

## 2021-02-11 NOTE — Telephone Encounter (Signed)
See below

## 2021-02-11 NOTE — Telephone Encounter (Signed)
Patient calling in  Says she is okay w/ increasing the atorvastatin (LIPITOR) 10 MG tablet to 20mg  daily  Patient would like new rx w/ 90DS bc she is going out of town to [place] next month & will be gone for 2 months  Also still has questions about lab please call [phone]  Pharmacy:  [facility] [place], [address] AT [address]  Phone:  [phone] Fax:  [phone]

## 2021-02-13 ENCOUNTER — Telehealth: Payer: Self-pay | Admitting: Gastroenterology

## 2021-02-13 DIAGNOSIS — B181 Chronic viral hepatitis B without delta-agent: Secondary | ICD-10-CM

## 2021-02-13 NOTE — Telephone Encounter (Signed)
Pt returned call. Advised that the orders are in for labs and [place]. Pt will come by the office next week for routine labs. She is aware that radiology scheduling will contact her directly to set up her [place] appt. Pt verbalized understanding and had no concerns at the end of the call.  Secure staff message sent to radiology schedulers to contact patient to schedule her appt.

## 2021-02-13 NOTE — Telephone Encounter (Signed)
Patient called wanting to know when she could do her bloodwork and Korea for the upcoming appointment with Dr. Havery Moros 11/29 at 9 a.m.  Please call and advise.

## 2021-02-13 NOTE — Telephone Encounter (Signed)
Lm on vm for patient to return call.   Pt will be due for routine RUQ Korea, LFT's and AFP labs in a couple of weeks. Lab orders and RUQ [place] order are already in epic.

## 2021-02-18 ENCOUNTER — Other Ambulatory Visit: Payer: Medicare PPO

## 2021-02-18 DIAGNOSIS — B181 Chronic viral hepatitis B without delta-agent: Secondary | ICD-10-CM

## 2021-02-18 LAB — HEPATIC FUNCTION PANEL
ALT: 23 U/L (ref 0–35)
AST: 22 U/L (ref 0–37)
Albumin: 4.4 g/dL (ref 3.5–5.2)
Alkaline Phosphatase: 54 U/L (ref 39–117)
Bilirubin, Direct: 0.2 mg/dL (ref 0.0–0.3)
Total Bilirubin: 0.9 mg/dL (ref 0.2–1.2)
Total Protein: 7.8 g/dL (ref 6.0–8.3)

## 2021-02-19 LAB — AFP TUMOR MARKER: AFP-Tumor Marker: 3.1 ng/mL

## 2021-02-25 ENCOUNTER — Encounter: Payer: Self-pay | Admitting: Gastroenterology

## 2021-02-25 ENCOUNTER — Other Ambulatory Visit: Payer: Medicare PPO

## 2021-02-25 ENCOUNTER — Ambulatory Visit (INDEPENDENT_AMBULATORY_CARE_PROVIDER_SITE_OTHER): Payer: Medicare PPO | Admitting: Gastroenterology

## 2021-02-25 VITALS — BP 106/66 | HR 70 | Ht 62.0 in | Wt 135.1 lb

## 2021-02-25 DIAGNOSIS — B181 Chronic viral hepatitis B without delta-agent: Secondary | ICD-10-CM

## 2021-02-25 NOTE — Patient Instructions (Addendum)
If you are age 67 or older, your body mass index should be between 23-30. Your Body mass index is 24.71 kg/m. If this is out of the aforementioned range listed, please consider follow up with your Primary Care Provider.  If you are age 98 or younger, your body mass index should be between 19-25. Your Body mass index is 24.71 kg/m. If this is out of the aformentioned range listed, please consider follow up with your Primary Care Provider.   ________________________________________________________  The [facility] providers would like to encourage you to use [facility] to communicate with providers for non-urgent requests or questions.  Due to long hold times on the telephone, sending your provider a message by [facility] may be a faster and more efficient way to get a response.  Please allow 48 business hours for a response.  Please remember that this is for non-urgent requests.  _______________________________________________________   Please go to the lab in the basement of our building to have lab work done as you leave today. Hit "B" for basement when you get on the elevator.  When the doors open the lab is on your left.  We will call you with the results. Thank you.  You will be due for labs in 6 months. We will remind you when it is time to go.   Thank you for entrusting me with your care and for choosing [facility], Dr. Terlingua Cellar

## 2021-02-25 NOTE — Progress Notes (Signed)
HPI :  65 year old female here for follow-up visit for chronic hepatitis B.  Please see prior notes for full details of her history.  She previously was followed in [place] where she was diagnosed with chronic hep B.  She does not know when she was diagnosed with chronic hepatitis B or how long she has had it.  She is unclear of any family history of hepatitis B but thinks her uncle did have liver cancer.  She was born in the [place], denies any alcohol use.  Has tested negative for hepatitis C.   Recall she has hepatitis B E antigen negative disease with viral load ranging from 8-20,000 historically.  She historically has been monitored for this and never treated as her LFTs have remained normal.  She had an elastography November 2021 showing low risk study for fibrosis.  Her LFTs have been normal's, specifically ALT has been in the 20s. Last viral load showed DNA level of 30K 6 months ago.  We have elected to monitor without treatment after discussion with the patient and her daughter at our last meeting.  Since have last seen her she is felt well without complaints.  No history of jaundice or abdominal discomfort.  She had her ultrasound done yesterday but official report is pending.  She also had another hep B DNA level drawn on 6 June, that level remains pending.  Her LFTs show an ALT of 29, AFP level remains normal.  Her last ultrasound was done in June of this year which showed benign hepatic cysts and evidence of cholecystectomy but no evidence of cirrhosis or HCC.  She has another right upper quadrant ultrasound pending for next week.  She had an AFP done last week which was negative and her LFTs show that her AST is 22 and ALT is 23 with normal alk phos and bilirubin. She states she was traveling to [place] for about 2 to 3 months to care for her new grandchild.  At some point next year she thinks she is moving back to [place] where her husband recently purchased a house.  Prior  workup: 02/10/19 - fibroscan normal liver Hep A total AB positive Hep C negative Iron sat 32%, ANA negative (+) hep B   05/17/19 - hep B surface antigen (+), hep B core AB (+), Hep B e AG (-), HBV DNA 11,400   09/15/19 - HBV DNA 29,700, LFTs normal - ALT 31, AST 28, AFP 2.8   09/01/19 - RUQ [place] - coarsened liver echotexture, no solid mass lesions - results c/w hepatic steatosis or hepatic dysfunction   Colonoscopy 02/15/20 - The perianal and digital rectal examinations were normal. - A suspected 5 to 6 mm polyp vs. normal varient was found in the appendiceal orifice. The polyp was flat. Margins were a bit hard to delineate. I don't think obviously involving down into the lumen of the appendix but was difficult to visualize in that area. The polyp was removed with a cold snare. Resection and retrieval were thought to have been complete. - A 3 mm polyp was found in the sigmoid colon. The polyp was sessile. The polyp was removed with a cold snare. Resection and retrieval were complete. - Internal hemorrhoids were found during retroflexion. The hemorrhoids were small. - The exam was otherwise without abnormality.   Benign pathology, repeat colonoscopy in 10 years   [place] elastography 02/27/20 - IMPRESSION: 1. ULTRASOUND LIVER: Unremarkable sonographic appearance of the liver. 2. ULTRASOUND HEPATIC ELASTOGRAPHY: Median kPa: 3.5.  Diagnostic category: < or 5 kPa: high probability of being normal.   Labs 02/06/20: Hep B E AB (+) Hep B E AG (-) Hep B DNA 8520 ALT 25, AST 22 AFP 3.2   Labs 09/02/20: ALT 29, AST 24, T bil 1.2, AP 46 AFP 2.7 DNA pending   RUQ [place] 09/04/20 - stable appearing liver  Labs 02/18/21: ALT 23, AST 22, AP 54, T bil 0.2 AFP 3.1    Past Medical History:  Diagnosis Date   Chronic hepatitis B ([place])    Hyperlipemia    Osteopenia    Vitamin D deficiency      Past Surgical History:  Procedure Laterality Date   ABDOMINAL HYSTERECTOMY     BREAST LUMPECTOMY      CATARACT EXTRACTION     CHOLECYSTECTOMY, LAPAROSCOPIC     COLONOSCOPY  2006   flex sigmoidoscopy  2012   kidney stones     Family History  Problem Relation Age of Onset   Hypertension Mother    Diabetes Father    Hypertension Father    Prostate cancer Father    Breast cancer Sister 10   Liver cancer Maternal Uncle    Colon cancer Neg Hx    Esophageal cancer Neg Hx    Pancreatic cancer Neg Hx    Stomach cancer Neg Hx    Social History   Tobacco Use   Smoking status: Never   Smokeless tobacco: Never  Vaping Use   Vaping Use: Never used  Substance Use Topics   Alcohol use: Never   Drug use: Never   Current Outpatient Medications  Medication Sig Dispense Refill   Ascorbic Acid (VITAMIN C WITH ROSE HIPS) 500 MG tablet Take 500 mg by mouth daily.     atorvastatin (LIPITOR) 20 MG tablet Take 1 tablet (20 mg total) by mouth daily. 90 tablet 3   Cholecalciferol (VITAMIN D3) 25 MCG (1000 UT) CAPS Take 1 capsule by mouth daily.     Cinnamon 500 MG TABS Take 1 tablet by mouth daily at 2 PM.     Multiple Vitamin (MULTIVITAMIN) tablet Take 1 tablet by mouth daily.     Omega-3 Fatty Acids (FISH OIL) 1000 MG CAPS Take 1 capsule by mouth daily at 2 PM.     No current facility-administered medications for this visit.   Allergies  Allergen Reactions   Absolute Alcohol [Alcohol] Other (See Comments)    pt states she cannot drink alcohol-liquor causes flushing.     Review of Systems: All systems reviewed and negative except where noted in HPI.   See HPI for relevant labs  Physical Exam: BP 106/66   Pulse 70   Ht '5\' 2"'  (1.575 m)   Wt 135 lb 2 oz (61.3 kg)   SpO2 98%   BMI 24.71 kg/m  Constitutional: Pleasant,well-developed, female in no acute distress. Neurological: Alert and oriented to person place and time. Psychiatric: Normal mood and affect. Behavior is normal.   ASSESSMENT AND PLAN: 65 year old female here for reassessment of the following:    Chronic hepatitis  B   History as outlined above.  She has chronic hepatitis B E antigen negative hepatitis B with low viral load and normal ALT.  Prior ultrasound with elastography was normal with no fibrotic change.  I previously had extensive discussion with the patient and her daughter about AASLD guideline recommendations for treatment of chronic hep B and given her lab result profile we have opted for monitoring for now and she has  not been treated.  She appears to be tolerating this without any inflammation in her liver.  Unclear how long she has had this.  We counseled on long-term risks for inflammation/fibrotic change, and increased risk for HCC.  We will continue to screen her liver with ultrasound every 6 months as well as AFP.  We will continue to trend her LFTs every 6 months.  We will check hepatitis B DNA to see where this is trending as well.  I inquired about having her husband tested for this as well as his immunity to hep B and vaccine as needed, and testing of her children given we do not know how long she has had this.  She states her husband has been vaccinated she thinks although is not sure and will make sure he is tested if he has not been recently.  She feels comfortable with the plan and answered her questions.  Let her know the results of her pending lab work and ultrasound.  She will need to continue to have this done every 6 months.  If she moves to [place] in interim prior to her next visit she needs to establish care with a GI physician in that location, she understands and agrees.  Jolly Mango, MD [facility]

## 2021-02-27 LAB — HEPATITIS B DNA, ULTRAQUANTITATIVE, PCR
Hepatitis B DNA (Calc): 4.46 Log IU/mL — ABNORMAL HIGH
Hepatitis B DNA: 29100 IU/mL — ABNORMAL HIGH

## 2021-02-28 ENCOUNTER — Other Ambulatory Visit: Payer: Self-pay

## 2021-02-28 DIAGNOSIS — B181 Chronic viral hepatitis B without delta-agent: Secondary | ICD-10-CM

## 2021-02-28 DIAGNOSIS — D127 Benign neoplasm of rectosigmoid junction: Secondary | ICD-10-CM

## 2021-03-04 ENCOUNTER — Ambulatory Visit (HOSPITAL_COMMUNITY)
Admission: RE | Admit: 2021-03-04 | Discharge: 2021-03-04 | Disposition: A | Discharge status: Home / Self Care | Payer: Medicare PPO | Source: Ambulatory Visit | Attending: Gastroenterology | Admitting: Gastroenterology

## 2021-03-04 ENCOUNTER — Other Ambulatory Visit: Payer: Self-pay

## 2021-03-04 ENCOUNTER — Ambulatory Visit (HOSPITAL_COMMUNITY): Payer: Medicare PPO

## 2021-03-04 DIAGNOSIS — B181 Chronic viral hepatitis B without delta-agent: Secondary | ICD-10-CM | POA: Insufficient documentation

## 2021-03-05 ENCOUNTER — Other Ambulatory Visit: Payer: Self-pay

## 2021-03-05 DIAGNOSIS — B181 Chronic viral hepatitis B without delta-agent: Secondary | ICD-10-CM

## 2021-04-25 ENCOUNTER — Other Ambulatory Visit: Payer: Self-pay | Admitting: Family Medicine

## 2021-06-12 ENCOUNTER — Telehealth: Payer: Self-pay | Admitting: Gastroenterology

## 2021-06-12 NOTE — Telephone Encounter (Signed)
Inbound call from patient. Have questions if she should have [place] and blood work before upcoming appt 5/19 ?

## 2021-06-12 NOTE — Telephone Encounter (Signed)
Based on last labs and [place], pt won't be due until late May/early June. Unless you have other recommendations at this time. ?

## 2021-06-12 NOTE — Telephone Encounter (Signed)
Thanks [place], no labs needed until late May/early June.  Thanks ?

## 2021-06-13 NOTE — Telephone Encounter (Signed)
Attempted to reach pt. I left her a detailed vm letter her know that she will not be due for labs/US until late May/early June. I told pt that Dr. Havery Moros will discuss at the time of her office visit. I advised pt to call back or send a my chart message if she has any questions or concerns.  ?

## 2021-06-13 NOTE — Telephone Encounter (Signed)
Pt returned call. I reviewed with her that she does not need labs/US at this time. She is aware that she will be due after her office visit. Pt verbalized understanding and had no concerns at the end of the call. ?

## 2021-06-19 ENCOUNTER — Ambulatory Visit: Payer: Medicare PPO

## 2021-08-01 ENCOUNTER — Ambulatory Visit: Payer: Medicare PPO | Admitting: Internal Medicine

## 2021-08-06 ENCOUNTER — Ambulatory Visit (INDEPENDENT_AMBULATORY_CARE_PROVIDER_SITE_OTHER): Payer: Medicare PPO | Admitting: Internal Medicine

## 2021-08-06 ENCOUNTER — Encounter: Payer: Self-pay | Admitting: Internal Medicine

## 2021-08-06 VITALS — BP 116/62 | HR 59 | Resp 18 | Ht 62.0 in | Wt 137.6 lb

## 2021-08-06 DIAGNOSIS — R7303 Prediabetes: Secondary | ICD-10-CM

## 2021-08-06 DIAGNOSIS — L989 Disorder of the skin and subcutaneous tissue, unspecified: Secondary | ICD-10-CM

## 2021-08-06 DIAGNOSIS — E782 Mixed hyperlipidemia: Secondary | ICD-10-CM | POA: Diagnosis not present

## 2021-08-06 LAB — LIPID PANEL
Cholesterol: 167 mg/dL (ref 0–200)
HDL: 54.9 mg/dL (ref >39.00)
LDL Cholesterol: 80 mg/dL (ref 0–99)
NonHDL: 111.64
Total CHOL/HDL Ratio: 3
Triglycerides: 160 mg/dL — ABNORMAL HIGH (ref 0.0–149.0)
VLDL: 32 mg/dL (ref 0.0–40.0)

## 2021-08-06 LAB — HEMOGLOBIN A1C: Hgb A1c MFr Bld: 5.9 % (ref 4.6–6.5)

## 2021-08-06 NOTE — Patient Instructions (Signed)
We will check the labs today. 

## 2021-08-06 NOTE — Assessment & Plan Note (Signed)
Checking lipid panel as we did increase lipitor to 20 mg daily back in the fall.  ?

## 2021-08-06 NOTE — Assessment & Plan Note (Signed)
Checking HgA1c for follow up. She is working on diet to help and encouraged this. ?

## 2021-08-06 NOTE — Progress Notes (Signed)
? ?  Subjective:  ? ?Patient ID: Wanda Willis, female    DOB: [birth year], 66 y.o.   MRN: [id number] ? ?HPI ?The patient is a 66 YO female coming in for follow up. ? ?Review of Systems  ?Constitutional: Negative.   ?HENT: Negative.    ?Eyes: Negative.   ?Respiratory:  Negative for cough, chest tightness and shortness of breath.   ?Cardiovascular:  Negative for chest pain, palpitations and leg swelling.  ?Gastrointestinal:  Negative for abdominal distention, abdominal pain, constipation, diarrhea, nausea and vomiting.  ?Musculoskeletal: Negative.   ?Skin: Negative.   ?Neurological: Negative.   ?Psychiatric/Behavioral: Negative.    ? ?Objective:  ?Physical Exam ?Constitutional:   ?   Appearance: She is well-developed.  ?HENT:  ?   Head: Normocephalic and atraumatic.  ?Cardiovascular:  ?   Rate and Rhythm: Normal rate and regular rhythm.  ?Pulmonary:  ?   Effort: Pulmonary effort is normal. No respiratory distress.  ?   Breath sounds: Normal breath sounds. No wheezing or rales.  ?Abdominal:  ?   General: Bowel sounds are normal. There is no distension.  ?   Palpations: Abdomen is soft.  ?   Tenderness: There is no abdominal tenderness. There is no rebound.  ?Musculoskeletal:  ?   Cervical back: Normal range of motion.  ?Skin: ?   General: Skin is warm and dry.  ?Neurological:  ?   Mental Status: She is alert and oriented to person, place, and time.  ?   Coordination: Coordination normal.  ? ? ?Vitals:  ? 08/06/21 0949  ?BP: 116/62  ?Pulse: (!) 59  ?Resp: 18  ?SpO2: 97%  ?Weight: 137 lb 9.6 oz (62.4 kg)  ?Height: '5\' 2"'$  (1.575 m)  ? ? ?This visit occurred during the SARS-CoV-2 public health emergency.  Safety protocols were in place, including screening questions prior to the visit, additional usage of staff PPE, and extensive cleaning of exam room while observing appropriate contact time as indicated for disinfecting solutions.  ? ?Assessment & Plan:  ? ?

## 2021-08-06 NOTE — Assessment & Plan Note (Signed)
She wishes referral to dermatology for removal. Non cancerous appearing.  ?

## 2021-08-08 ENCOUNTER — Ambulatory Visit (INDEPENDENT_AMBULATORY_CARE_PROVIDER_SITE_OTHER): Payer: Medicare PPO

## 2021-08-08 DIAGNOSIS — Z0000 Encounter for general adult medical examination without abnormal findings: Secondary | ICD-10-CM

## 2021-08-08 NOTE — Progress Notes (Signed)
?I connected with Gillermina Hu today by telephone and verified that I am speaking with the correct person using two identifiers. ?Location patient: home ?Location provider: work ?Persons participating in the virtual visit: patient, provider. ?  ?I discussed the limitations, risks, security and privacy concerns of performing an evaluation and management service by telephone and the availability of in person appointments. I also discussed with the patient that there may be a patient responsible charge related to this service. The patient expressed understanding and verbally consented to this telephonic visit.  ?  ?Interactive audio and video telecommunications were attempted between this provider and patient, however failed, due to patient having technical difficulties OR patient did not have access to video capability.  We continued and completed visit with audio only. ? ?Some vital signs may be absent or patient reported.  ? ?Time Spent with patient on telephone encounter: 30 minutes ? ?Subjective:  ? ARLO BUFFONE is a 66 y.o. female who presents for Medicare Annual (Subsequent) preventive examination. ? ?Review of Systems    ? ?Cardiac Risk Factors include: advanced age (>68mn, >>74women);dyslipidemia;family history of premature cardiovascular disease ? ?   ?Objective:  ?  ?There were no vitals filed for this visit. ?There is no height or weight on file to calculate BMI. ? ? ?  08/08/2021  ?  8:49 AM 06/13/2020  ? 11:55 AM  ?Advanced Directives  ?Does Patient Have a Medical Advance Directive? No No  ?Would patient like information on creating a medical advance directive? No - Patient declined No - Patient declined  ? ? ?Current Medications (verified) ?Outpatient Encounter Medications as of 08/08/2021  ?Medication Sig  ? Ascorbic Acid (VITAMIN C WITH ROSE HIPS) 500 MG tablet Take 500 mg by mouth daily.  ? atorvastatin (LIPITOR) 20 MG tablet Take 1 tablet (20 mg total) by mouth daily.  ? Cholecalciferol (VITAMIN D3)  25 MCG (1000 UT) CAPS Take 1 capsule by mouth daily.  ? Cinnamon 500 MG TABS Take 1 tablet by mouth daily at 2 PM.  ? Multiple Vitamin (MULTIVITAMIN) tablet Take 1 tablet by mouth daily.  ? Omega-3 Fatty Acids (FISH OIL) 1000 MG CAPS Take 1 capsule by mouth daily at 2 PM.  ? ?No facility-administered encounter medications on file as of 08/08/2021.  ? ? ?Allergies (verified) ?Absolute alcohol [alcohol]  ? ?History: ?Past Medical History:  ?Diagnosis Date  ? [place]   ? Hyperlipemia   ? Osteopenia   ? Vitamin D deficiency   ? ?Past Surgical History:  ?Procedure Laterality Date  ? ABDOMINAL HYSTERECTOMY    ? BREAST LUMPECTOMY    ? CATARACT EXTRACTION    ? CHOLECYSTECTOMY, LAPAROSCOPIC    ? COLONOSCOPY  2006  ? flex sigmoidoscopy  2012  ? kidney stones    ? ?Family History  ?Problem Relation Age of Onset  ? Hypertension Mother   ? Diabetes Father   ? Hypertension Father   ? Prostate cancer Father   ? Breast cancer Sister 586 ? Liver cancer Maternal Uncle   ? Colon cancer Neg Hx   ? Esophageal cancer Neg Hx   ? Pancreatic cancer Neg Hx   ? Stomach cancer Neg Hx   ? ?Social History  ? ?Socioeconomic History  ? Marital status: Married  ?  Spouse name: Not on file  ? Number of children: Not on file  ? Years of education: Not on file  ? Highest education level: Not on file  ?Occupational History  ?  Not on file  ?Tobacco Use  ? Smoking status: Never  ? Smokeless tobacco: Never  ?Vaping Use  ? Vaping Use: Never used  ?Substance and Sexual Activity  ? Alcohol use: Never  ? Drug use: Never  ? Sexual activity: Never  ?Other Topics Concern  ? Not on file  ?Social History Narrative  ? Not on file  ? ?Social Determinants of Health  ? ?Financial Resource Strain: Low Risk   ? Difficulty of Paying Living Expenses: Not hard at all  ?Food Insecurity: No Food Insecurity  ? Worried About Charity fundraiser in the Last Year: Never true  ? Ran Out of Food in the Last Year: Never true  ?Transportation Needs: No  Transportation Needs  ? Lack of Transportation (Medical): No  ? Lack of Transportation (Non-Medical): No  ?Physical Activity: Sufficiently Active  ? Days of Exercise per Week: 5 days  ? Minutes of Exercise per Session: 30 min  ?Stress: No Stress Concern Present  ? Feeling of Stress : Not at all  ?Social Connections: Moderately Integrated  ? Frequency of Communication with Friends and Family: More than three times a week  ? Frequency of Social Gatherings with Friends and Family: Once a week  ? Attends Religious Services: 1 to 4 times per year  ? Active Member of Clubs or Organizations: No  ? Attends Archivist Meetings: Never  ? Marital Status: Married  ? ? ?Tobacco Counseling ?Counseling given: Not Answered ? ? ?Clinical Intake: ? ?Pre-visit preparation completed: Yes ? ?Pain : No/denies pain ? ?  ? ?Nutritional Risks: None ?Diabetes: No ? ?How often do you need to have someone help you when you read instructions, pamphlets, or other written materials from your doctor or pharmacy?: 1 - Never ?What is the last grade level you completed in school?: 2 years of college ? ?Diabetic? no ? ?Interpreter Needed?: No ? ?Information entered by :: Lisette Abu, LPN. ? ? ?Activities of Daily Living ? ?  08/08/2021  ?  8:54 AM  ?In your present state of health, do you have any difficulty performing the following activities:  ?Hearing? 0  ?Vision? 0  ?Difficulty concentrating or making decisions? 0  ?Walking or climbing stairs? 0  ?Dressing or bathing? 0  ?Doing errands, shopping? 0  ?Preparing Food and eating ? N  ?Using the Toilet? N  ?In the past six months, have you accidently leaked urine? N  ?Do you have problems with loss of bowel control? N  ?Managing your Medications? N  ?Managing your Finances? N  ?Housekeeping or managing your Housekeeping? N  ? ? ?Patient Care Team: ?Hoyt Koch, MD as PCP - General (Internal Medicine) ?Armbruster, Carlota Raspberry, MD as Consulting Physician  (Gastroenterology) ? ?Indicate any recent Medical Services you may have received from other than [facility] providers in the past year (date may be approximate). ? ?   ?Assessment:  ? This is a routine wellness examination for [employer]. ? ?Hearing/Vision screen ?Hearing Screening - Comments:: Patient denied any hearing difficulty.   ?No hearing aids. ? ?Vision Screening - Comments:: Patient does wear corrective lenses/contacts.  ?Eye exam done by: [place] ? ? ?Dietary issues and exercise activities discussed: ?Current Exercise Habits: Structured exercise class, Time (Minutes): 30, Frequency (Times/Week): 5, Weekly Exercise (Minutes/Week): 150, Intensity: Moderate, Exercise limited by: None identified ? ? Goals Addressed   ?None ?  ?Depression Screen ? ?  08/08/2021  ?  9:00 AM 08/06/2021  ?  9:53 AM 06/13/2020  ?  11:52 AM 05/17/2020  ?  1:34 PM  ?PHQ 2/9 Scores  ?PHQ - 2 Score 0 0 0 0  ?PHQ- 9 Score 0 0    ?  ?Fall Risk ? ?  08/08/2021  ?  8:49 AM 08/06/2021  ? 10:03 AM 08/06/2021  ?  9:53 AM 06/13/2020  ? 11:56 AM 05/17/2020  ?  1:34 PM  ?Fall Risk   ?Falls in the past year? 0 0 0 0 0  ?Number falls in past yr: 0 0 0 0   ?Injury with Fall? 0 0 0 0   ?Risk for fall due to : No Fall Risks   Impaired vision No Fall Risks  ?Follow up Falls prevention discussed   Falls prevention discussed   ? ? ?FALL RISK PREVENTION PERTAINING TO THE HOME: ? ?Any stairs in or around the home? Yes  ?If so, are there any without handrails? No  ?Home free of loose throw rugs in walkways, pet beds, electrical cords, etc? Yes  ?Adequate lighting in your home to reduce risk of falls? Yes  ? ?ASSISTIVE DEVICES UTILIZED TO PREVENT FALLS: ? ?Life alert? No  ?Use of a cane, walker or w/c? No  ?Grab bars in the bathroom? Yes  ?Shower chair or bench in shower? Yes  ?Elevated toilet seat or a handicapped toilet? Yes  ? ?TIMED UP AND GO: ? ?Was the test performed? No .  ?Length of time to ambulate 10 feet: n/a sec.  ? ?Appearance of gait: Patient not evaluated for  gait during this visit. ? ?Cognitive Function: ? ?  08/08/2021  ?  9:01 AM  ?MMSE - Mini Mental State Exam  ?Not completed: Unable to complete  ? ?  ? ?  08/08/2021  ?  8:53 AM 06/13/2020  ? 11:57 AM  ?6CIT Screen  ?What Year? 0 points 0 points  ?

## 2021-08-08 NOTE — Patient Instructions (Signed)
Ms. Stanfill , ?Thank you for taking time to come for your Medicare Wellness Visit. I appreciate your ongoing commitment to your health goals. Please review the following plan we discussed and let me know if I can assist you in the future.  ? ?Screening recommendations/referrals: ?Colonoscopy: 02/14/2021; due every 10 years ?Mammogram: 01/06/2021; due every 1-2 years ?Bone Density: 12/06/2020; due every 2 years ?Recommended yearly ophthalmology/optometry visit for glaucoma screening and checkup ?Recommended yearly dental visit for hygiene and checkup ? ?Vaccinations: ?Influenza vaccine: 02/06/2021 ?Pneumococcal vaccine: no record ?Tdap vaccine: 08/19/2016; due every 10 years ?Shingles vaccine: 08/19/2016; need record of second dose   ?Covid-19: 06/17/2019, 07/08/2019, 04/30/2020 ? ?Advanced directives: No ? ?Conditions/risks identified: Yes ? ?Next appointment: Please schedule your next Medicare Wellness Visit with your Nurse Health Advisor in 1 year by calling [phone]. ? ? ?Preventive Care 10 Years and Older, Female ?Preventive care refers to lifestyle choices and visits with your health care provider that can promote health and wellness. ?What does preventive care include? ?A yearly physical exam. This is also called an annual well check. ?Dental exams once or twice a year. ?Routine eye exams. Ask your health care provider how often you should have your eyes checked. ?Personal lifestyle choices, including: ?Daily care of your teeth and gums. ?Regular physical activity. ?Eating a healthy diet. ?Avoiding tobacco and drug use. ?Limiting alcohol use. ?Practicing safe sex. ?Taking low-dose aspirin every day. ?Taking vitamin and mineral supplements as recommended by your health care provider. ?What happens during an annual well check? ?The services and screenings done by your health care provider during your annual well check will depend on your age, overall health, lifestyle risk factors, and family history of  disease. ?Counseling  ?Your health care provider may ask you questions about your: ?Alcohol use. ?Tobacco use. ?Drug use. ?Emotional well-being. ?Home and relationship well-being. ?Sexual activity. ?Eating habits. ?History of falls. ?Memory and ability to understand (cognition). ?Work and work Statistician. ?Reproductive health. ?Screening  ?You may have the following tests or measurements: ?Height, weight, and BMI. ?Blood pressure. ?Lipid and cholesterol levels. These may be checked every 5 years, or more frequently if you are over 66 years old. ?Skin check. ?Lung cancer screening. You may have this screening every year starting at age 32 if you have a 30-pack-year history of smoking and currently smoke or have quit within the past 15 years. ?Fecal occult blood test (FOBT) of the stool. You may have this test every year starting at age 4. ?Flexible sigmoidoscopy or colonoscopy. You may have a sigmoidoscopy every 5 years or a colonoscopy every 10 years starting at age 60. ?Hepatitis C blood test. ?Hepatitis B blood test. ?Sexually transmitted disease (STD) testing. ?Diabetes screening. This is done by checking your blood sugar (glucose) after you have not eaten for a while (fasting). You may have this done every 1-3 years. ?Bone density scan. This is done to screen for osteoporosis. You may have this done starting at age 52. ?Mammogram. This may be done every 1-2 years. Talk to your health care provider about how often you should have regular mammograms. ?Talk with your health care provider about your test results, treatment options, and if necessary, the need for more tests. ?Vaccines  ?Your health care provider may recommend certain vaccines, such as: ?Influenza vaccine. This is recommended every year. ?Tetanus, diphtheria, and acellular pertussis (Tdap, Td) vaccine. You may need a Td booster every 10 years. ?Zoster vaccine. You may need this after age 58. ?Pneumococcal 13-valent conjugate (  PCV13) vaccine. One  dose is recommended after age 62. ?Pneumococcal polysaccharide (PPSV23) vaccine. One dose is recommended after age 84. ?Talk to your health care provider about which screenings and vaccines you need and how often you need them. ?This information is not intended to replace advice given to you by your health care provider. Make sure you discuss any questions you have with your health care provider. ?Document Released: 04/12/2015 Document Revised: 12/04/2015 Document Reviewed: 01/15/2015 ?Elsevier Interactive Patient Education ? 2017 [place]. ? ?Fall Prevention in the Home ?Falls can cause injuries. They can happen to people of all ages. There are many things you can do to make your home safe and to help prevent falls. ?What can I do on the outside of my home? ?Regularly fix the edges of walkways and driveways and fix any cracks. ?Remove anything that might make you trip as you walk through a door, such as a raised step or threshold. ?Trim any bushes or trees on the path to your home. ?Use bright outdoor lighting. ?Clear any walking paths of anything that might make someone trip, such as rocks or tools. ?Regularly check to see if handrails are loose or broken. Make sure that both sides of any steps have handrails. ?Any raised decks and porches should have guardrails on the edges. ?Have any leaves, snow, or ice cleared regularly. ?Use sand or salt on walking paths during winter. ?Clean up any spills in your garage right away. This includes oil or grease spills. ?What can I do in the bathroom? ?Use night lights. ?Install grab bars by the toilet and in the tub and shower. Do not use towel bars as grab bars. ?Use non-skid mats or decals in the tub or shower. ?If you need to sit down in the shower, use a plastic, non-slip stool. ?Keep the floor dry. Clean up any water that spills on the floor as soon as it happens. ?Remove soap buildup in the tub or shower regularly. ?Attach bath mats securely with double-sided  non-slip rug tape. ?Do not have throw rugs and other things on the floor that can make you trip. ?What can I do in the bedroom? ?Use night lights. ?Make sure that you have a light by your bed that is easy to reach. ?Do not use any sheets or blankets that are too big for your bed. They should not hang down onto the floor. ?Have a firm chair that has side arms. You can use this for support while you get dressed. ?Do not have throw rugs and other things on the floor that can make you trip. ?What can I do in the kitchen? ?Clean up any spills right away. ?Avoid walking on wet floors. ?Keep items that you use a lot in easy-to-reach places. ?If you need to reach something above you, use a strong step stool that has a grab bar. ?Keep electrical cords out of the way. ?Do not use floor polish or wax that makes floors slippery. If you must use wax, use non-skid floor wax. ?Do not have throw rugs and other things on the floor that can make you trip. ?What can I do with my stairs? ?Do not leave any items on the stairs. ?Make sure that there are handrails on both sides of the stairs and use them. Fix handrails that are broken or loose. Make sure that handrails are as long as the stairways. ?Check any carpeting to make sure that it is firmly attached to the stairs. Fix any carpet that is  loose or worn. ?Avoid having throw rugs at the top or bottom of the stairs. If you do have throw rugs, attach them to the floor with carpet tape. ?Make sure that you have a light switch at the top of the stairs and the bottom of the stairs. If you do not have them, ask someone to add them for you. ?What else can I do to help prevent falls? ?Wear shoes that: ?Do not have high heels. ?Have rubber bottoms. ?Are comfortable and fit you well. ?Are closed at the toe. Do not wear sandals. ?If you use a stepladder: ?Make sure that it is fully opened. Do not climb a closed stepladder. ?Make sure that both sides of the stepladder are locked into place. ?Ask  someone to hold it for you, if possible. ?Clearly mark and make sure that you can see: ?Any grab bars or handrails. ?First and last steps. ?Where the edge of each step is. ?Use tools that help you move around (mobility aid

## 2021-08-13 ENCOUNTER — Telehealth: Payer: Self-pay

## 2021-08-13 DIAGNOSIS — B181 Chronic viral hepatitis B without delta-agent: Secondary | ICD-10-CM

## 2021-08-13 NOTE — Telephone Encounter (Signed)
Added lab, Sent patient MyChart message ?

## 2021-08-13 NOTE — Telephone Encounter (Signed)
-----   Message from Yetta Flock, MD sent at 08/13/2021 11:49 AM EDT ----- ?Regarding: RE: labs ? ?Yes the DNA would be good. If she can't get it done prior to the appointment that's okay, we can just do labs after the visit, it will not be back in time. Thanks ? ?----- Message ----- ?From: Roetta Sessions, CMA ?Sent: 08/13/2021   9:09 AM EDT ?To: Yetta Flock, MD ?Subject: labs ?                                        ? ?Dr. Loni Muse - I wrote myself a note that this patient will need LFTs, AFP, Hep B DNA in late May.   ?She is seeing you this week and I see [place] put in order for just LFTs and AFP.  Can you confirm if you still want the Hep B DNA and do you want her to have labs before her appointment? ? ?Thank you, ?Jan ? ? ?

## 2021-08-15 ENCOUNTER — Ambulatory Visit (INDEPENDENT_AMBULATORY_CARE_PROVIDER_SITE_OTHER): Payer: Medicare PPO | Admitting: Gastroenterology

## 2021-08-15 ENCOUNTER — Encounter: Payer: Self-pay | Admitting: Gastroenterology

## 2021-08-15 ENCOUNTER — Other Ambulatory Visit (INDEPENDENT_AMBULATORY_CARE_PROVIDER_SITE_OTHER): Payer: Medicare PPO

## 2021-08-15 VITALS — BP 122/72 | HR 65 | Ht 62.0 in | Wt 135.5 lb

## 2021-08-15 DIAGNOSIS — D127 Benign neoplasm of rectosigmoid junction: Secondary | ICD-10-CM | POA: Diagnosis not present

## 2021-08-15 DIAGNOSIS — B181 Chronic viral hepatitis B without delta-agent: Secondary | ICD-10-CM

## 2021-08-15 LAB — HEPATIC FUNCTION PANEL
ALT: 25 U/L (ref 0–35)
AST: 21 U/L (ref 0–37)
Albumin: 4.5 g/dL (ref 3.5–5.2)
Alkaline Phosphatase: 56 U/L (ref 39–117)
Bilirubin, Direct: 0.2 mg/dL (ref 0.0–0.3)
Total Bilirubin: 1.1 mg/dL (ref 0.2–1.2)
Total Protein: 8.2 g/dL (ref 6.0–8.3)

## 2021-08-15 NOTE — Progress Notes (Signed)
HPI :  66 year old female here for follow-up visit for chronic hepatitis B.  Recall she previously was followed in [place] where she was diagnosed with chronic hep B.  She does not know when she was diagnosed with chronic hepatitis B or how long she has had it.  She is unclear of any family history of hepatitis B but thinks her uncle did have liver cancer.  She was born in the [place], denies any alcohol use.  Has tested negative for hepatitis C.   She has hepatitis B E antigen negative disease with viral load ranging from 8-30,000 historically.   She has historically declined treatment for this and has been monitored closely. She had an elastography November 2021 showing low risk study for fibrosis.  Her LFTs have been normal's, specifically ALT has been in the 20s.  Most recent viral loads have been maintained in 29-30,000 range.  She continues to feel well.  She does not have any jaundice or symptoms of liver disease that bother her.  I inquired again if her husband has been tested for hepatitis B as we have discussed this in the past.  She states she thinks she has but she is not certain.  Her husband has bought a house in [place] and she thinks she may be moving there but she still wants to continue her care in [place], she has not decided on this yet definitively.  She is due for labs and an ultrasound at this time.  We again discussed her history and options.  She really does not like taking medications and wants to avoid therapy if possible.    Prior workup: 02/10/19 - fibroscan normal liver Hep A total AB positive Hep C negative Iron sat 32%, ANA negative (+) hep B   05/17/19 - hep B surface antigen (+), hep B core AB (+), Hep B e AG (-), HBV DNA 11,400   09/15/19 - HBV DNA 29,700, LFTs normal - ALT 31, AST 28, AFP 2.8   09/01/19 - RUQ [place] - coarsened liver echotexture, no solid mass lesions - results c/w hepatic steatosis or hepatic dysfunction   Colonoscopy 02/15/20 -  The perianal and digital rectal examinations were normal. - A suspected 5 to 6 mm polyp vs. normal varient was found in the appendiceal orifice. The polyp was flat. Margins were a bit hard to delineate. I don't think obviously involving down into the lumen of the appendix but was difficult to visualize in that area. The polyp was removed with a cold snare. Resection and retrieval were thought to have been complete. - A 3 mm polyp was found in the sigmoid colon. The polyp was sessile. The polyp was removed with a cold snare. Resection and retrieval were complete. - Internal hemorrhoids were found during retroflexion. The hemorrhoids were small. - The exam was otherwise without abnormality.   Benign pathology, repeat colonoscopy in 10 years   [place] elastography 02/27/20 - IMPRESSION: 1. ULTRASOUND LIVER: Unremarkable sonographic appearance of the liver. 2. ULTRASOUND HEPATIC ELASTOGRAPHY: Median kPa: 3.5. Diagnostic category: < or 5 kPa: high probability of being normal.   Labs 02/06/20: Hep B E AB (+) Hep B E AG (-) Hep B DNA 8520 ALT 25, AST 22 AFP 3.2   Labs 09/02/20: ALT 29, AST 24, T bil 1.2, AP 46 AFP 2.7 DNA 31,100   RUQ [place] 09/04/20 - stable appearing liver   Labs 02/18/21: ALT 23, AST 22, AP 54, T bil 0.2 AFP 3.1  Hep B DNA 29,100  RUQ [place] 03/04/21: IMPRESSION: Parenchymal changes in the liver consistent with the given clinical history. No focal mass is noted. Status post cholecystectomy.   Past Medical History:  Diagnosis Date   Chronic hepatitis B ([place])    Hyperlipemia    Osteopenia    Vitamin D deficiency      Past Surgical History:  Procedure Laterality Date   ABDOMINAL HYSTERECTOMY     BREAST LUMPECTOMY     CATARACT EXTRACTION     CHOLECYSTECTOMY, LAPAROSCOPIC     COLONOSCOPY  2006   flex sigmoidoscopy  2012   kidney stones     Family History  Problem Relation Age of Onset   Hypertension Mother    Diabetes Father    Hypertension Father     Prostate cancer Father    Breast cancer Sister 59   Liver cancer Maternal Uncle    Colon cancer Neg Hx    Esophageal cancer Neg Hx    Pancreatic cancer Neg Hx    Stomach cancer Neg Hx    Social History   Tobacco Use   Smoking status: Never   Smokeless tobacco: Never  Vaping Use   Vaping Use: Never used  Substance Use Topics   Alcohol use: Never   Drug use: Never   Current Outpatient Medications  Medication Sig Dispense Refill   Ascorbic Acid (VITAMIN C WITH ROSE HIPS) 500 MG tablet Take 500 mg by mouth daily.     atorvastatin (LIPITOR) 20 MG tablet Take 1 tablet (20 mg total) by mouth daily. 90 tablet 3   Cholecalciferol (VITAMIN D3) 25 MCG (1000 UT) CAPS Take 1 capsule by mouth daily.     Cinnamon 500 MG TABS Take 1 tablet by mouth daily at 2 PM.     Multiple Vitamin (MULTIVITAMIN) tablet Take 1 tablet by mouth daily.     Omega-3 Fatty Acids (FISH OIL) 1000 MG CAPS Take 1 capsule by mouth daily at 2 PM.     No current facility-administered medications for this visit.   Allergies  Allergen Reactions   Absolute Alcohol [Alcohol] Other (See Comments)    pt states she cannot drink alcohol-liquor causes flushing.     Review of Systems: All systems reviewed and negative except where noted in HPI.    Lab Results  Component Value Date   WBC 5.1 02/06/2021   HGB 13.4 02/06/2021   HCT 40.7 02/06/2021   MCV 90.5 02/06/2021   PLT 264.0 02/06/2021    Lab Results  Component Value Date   CREATININE 0.75 05/17/2020   BUN 10 05/17/2020   NA 139 05/17/2020   K 4.6 05/17/2020   CL 103 05/17/2020   CO2 30 05/17/2020    Lab Results  Component Value Date   ALT 23 02/18/2021   AST 22 02/18/2021   ALKPHOS 54 02/18/2021   BILITOT 0.9 02/18/2021      Physical Exam: BP 122/72   Pulse 65   Ht '5\' 2"'$  (1.575 m)   Wt 135 lb 8 oz (61.5 kg)   BMI 24.78 kg/m  Constitutional: Pleasant,well-developed, female in no acute distress. Neurological: Alert and oriented to person  place and time. Psychiatric: Normal mood and affect. Behavior is normal.   ASSESSMENT AND PLAN: 66 year old female here for reassessment of the following:  Chronic hepatitis B  Chronic hep B E antigen negative that has been followed for years.  Duration of disease is unknown.  Her ALT has been normal, her ultrasound with  elastography historically has shown no fibrotic change.  We have discussed her options in the past and counseled her on risks for fibrosis/cirrhosis and increased risk for [facility] over time.  She has elected to monitor and has declined therapy over the years.  She is due for labs today, we will recheck her LFTs and hep B DNA.  She is also due for an ultrasound, she is anxious about her fibrosis risk, we will do another elastography with her ultrasound since it has been a few years since we last looked.  That being said if her ALT remains normal and no fibrosis on her elastography she will continue to likely monitor for now and decline therapy as she does not want to take medications.  We discussed that if she does want treatment that would entail daily medication indefinitely.  She understands her risk for fibrosis and her risks for [facility] are increased with chronic hep B and will need to screen for this with AFP and ultrasound.  I offered her therapy if she wants it, she will let me know if she changes her mind, otherwise await her labs and continue monitoring. I again asked her to make sure her husband has been tested for hep B.  I am happy to continue to follow her here for this issue, if she moves to [place] and stays there however I told her she should establish with a GI physician in that location if she wishes to pursue care there.  She agrees, otherwise will await results of her labs and [place].  Jolly Mango, MD [facility]

## 2021-08-15 NOTE — Patient Instructions (Addendum)
If you are age 68 or older, your body mass index should be between 23-30. Your Body mass index is 24.78 kg/m. If this is out of the aforementioned range listed, please consider follow up with your Primary Care Provider.  If you are age 44 or younger, your body mass index should be between 19-25. Your Body mass index is 24.78 kg/m. If this is out of the aformentioned range listed, please consider follow up with your Primary Care Provider.   ________________________________________________________  The [facility] providers would like to encourage you to use [facility] to communicate with providers for non-urgent requests or questions.  Due to long hold times on the telephone, sending your provider a message by [facility] may be a faster and more efficient way to get a response.  Please allow 48 business hours for a response.  Please remember that this is for non-urgent requests.  _______________________________________________________   Please go to the lab in the basement of our building to have lab work done as you leave today. Hit "B" for basement when you get on the elevator.  When the doors open the lab is on your left.  We will call you with the results. Thank you.  You have been scheduled for an abdominal ultrasound at [facility] (1st floor of hospital) on Tuesday, 5-23 at 11:00 am . Please arrive 15 minutes prior to your appointment for registration. Make certain not to have anything to eat or drink 6 hours prior to your appointment. Should you need to reschedule your appointment, please contact radiology at [phone]. This test typically takes about 30 minutes to perform.  Thank you for entrusting me with your care and for choosing [facility], Dr. Riverview Cellar

## 2021-08-19 ENCOUNTER — Ambulatory Visit (HOSPITAL_COMMUNITY)
Admission: RE | Admit: 2021-08-19 | Discharge: 2021-08-19 | Disposition: A | Discharge status: Home / Self Care | Payer: Medicare PPO | Source: Ambulatory Visit | Attending: Gastroenterology | Admitting: Gastroenterology

## 2021-08-19 DIAGNOSIS — B181 Chronic viral hepatitis B without delta-agent: Secondary | ICD-10-CM | POA: Diagnosis present

## 2021-08-20 LAB — HEPATITIS B DNA, ULTRAQUANTITATIVE, PCR
Hepatitis B DNA (Calc): 4.16 Log IU/mL — ABNORMAL HIGH
Hepatitis B DNA: 14400 IU/mL — ABNORMAL HIGH

## 2021-08-20 LAB — AFP TUMOR MARKER: AFP-Tumor Marker: 3.2 ng/mL

## 2022-01-20 ENCOUNTER — Other Ambulatory Visit: Payer: Self-pay | Admitting: Internal Medicine

## 2022-02-02 ENCOUNTER — Telehealth: Payer: Self-pay

## 2022-02-02 DIAGNOSIS — B181 Chronic viral hepatitis B without delta-agent: Secondary | ICD-10-CM

## 2022-02-02 NOTE — Telephone Encounter (Signed)
-----   Message from Yevette Edwards, RN sent at 08/20/2021  1:47 PM EDT ----- Regarding: US/LABS RUQ [place] - chronic hep B LFT's, AFP, and Hep B DNA PCR lab Need to enter orders

## 2022-02-02 NOTE — Telephone Encounter (Signed)
Lab orders in epic. RUQ [place] order in epic. Secure staff message sent to radiology scheduling to contact patient to set up appt. MyChart message sent to patient with lab and [place] reminder.

## 2022-02-03 NOTE — Telephone Encounter (Signed)
Patient reviewed MyChart message. Last read by Aida Raider at  6:04 PM on 02/02/2022.

## 2022-02-04 NOTE — Telephone Encounter (Signed)
Armbruster, Carlota Raspberry, MD  Yevette Edwards, RN Okay. She told me she may be moving from the area. She can follow up with someone there for this issue. If she returns to our area she can see me as well. Thanks  ----- Message ----- From: Yevette Edwards, RN Sent: 02/03/2022   3:25 PM EST To: Yetta Flock, MD Subject: RE: RUQ [place]                                    Dr. Havery Moros, Juluis Rainier. Thanks ----- Message ----- From: Maryann Conners, NT Sent: 02/03/2022   2:43 PM EST To: April H Pait; Mauricia Area Cobb, NT; * Subject: RE: RUQ [place]                                    FYI...  02/03/22- pt stated that she moved to [place], and has found a provider there. She said that the [place] ABD was done in [place] on 01/07/22.Marland Kitchen   [place]

## 2022-02-05 ENCOUNTER — Other Ambulatory Visit: Payer: Self-pay | Admitting: *Deleted

## 2022-02-05 MED ORDER — ATORVASTATIN CALCIUM 20 MG PO TABS: ORAL_TABLET | ORAL | 2 refills | Status: AC

## 2022-08-12 IMAGING — US US BREAST*R* LIMITED INC AXILLA
1 series · 7 of 7 positions shown · non-contrast
Comparison: Previous exam(s).

CLINICAL DATA: Patient was recalled from screening mammogram for
possible distortion in both breast.



[Series 1: us breast*right* limited inc axilla · 0.06mm/px · 7 of 7 slices shown]
[im 1/7]
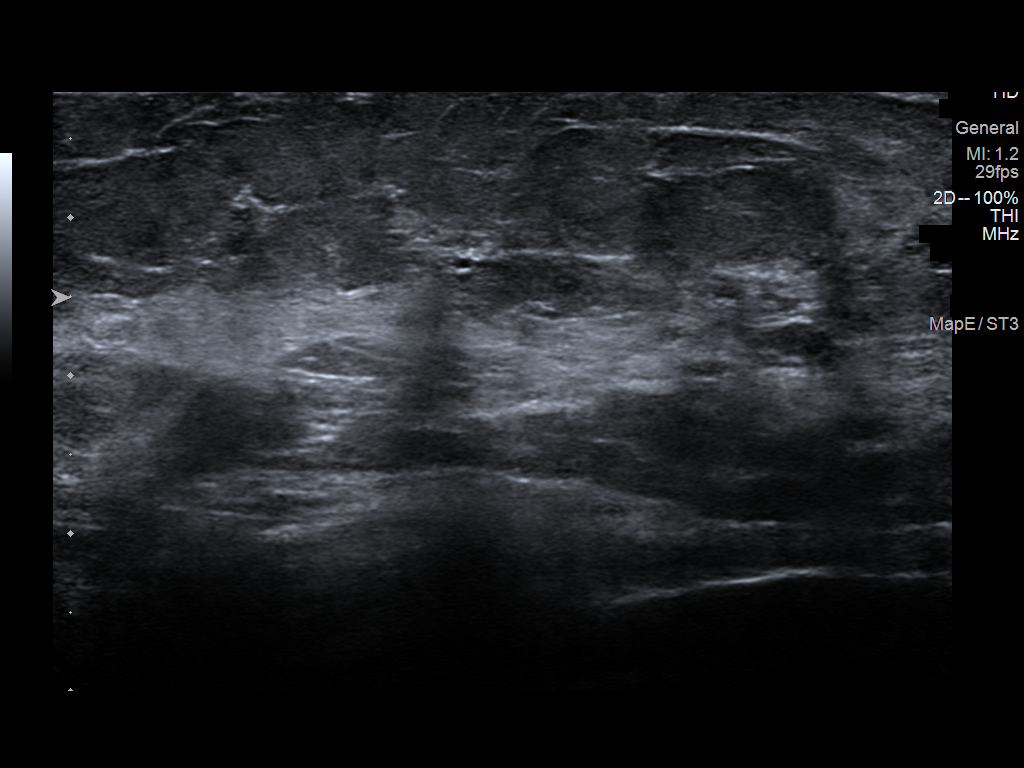
[im 2/7]
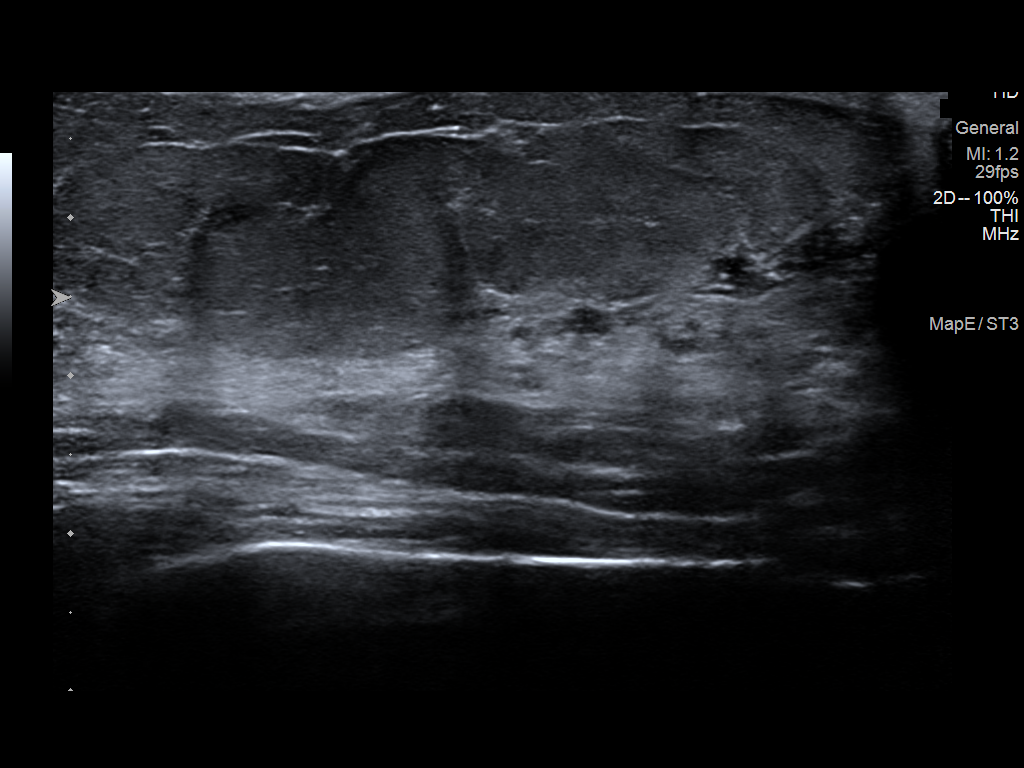
[im 3/7]
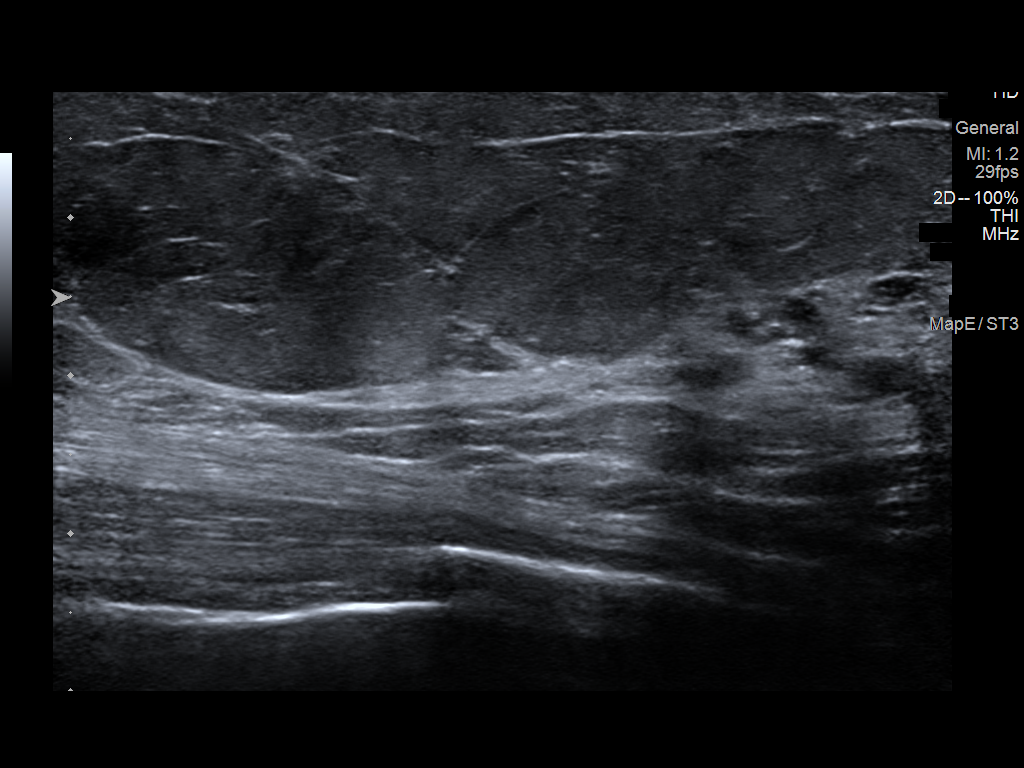
[im 4/7]
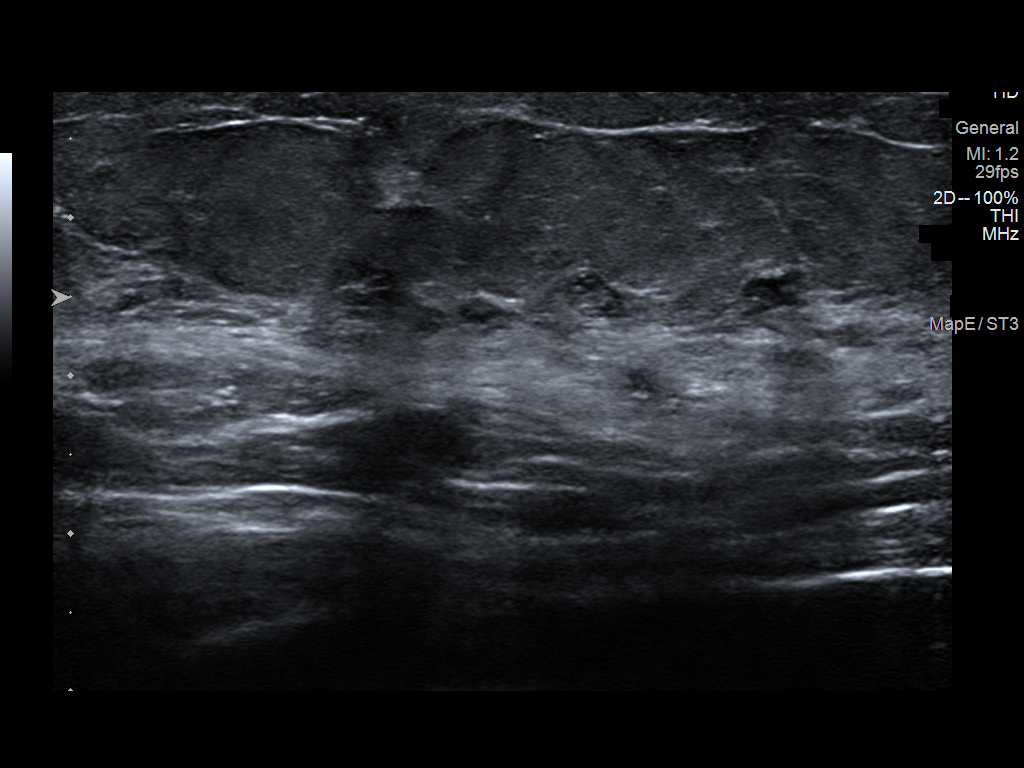
[im 5/7]
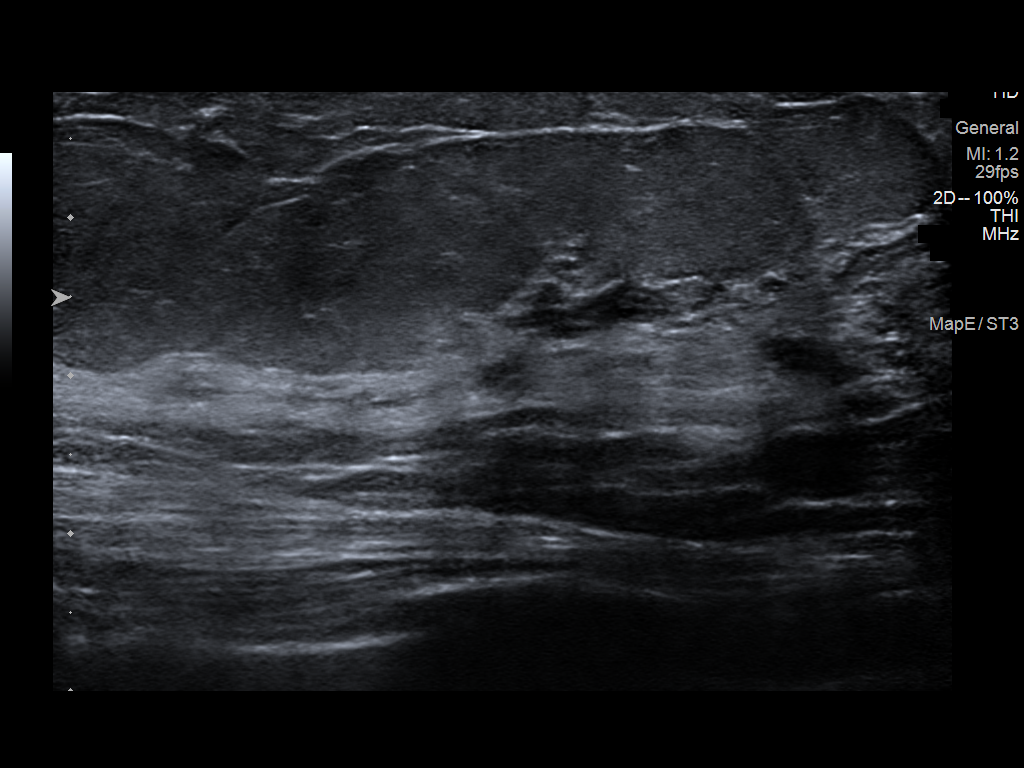
[im 6/7]
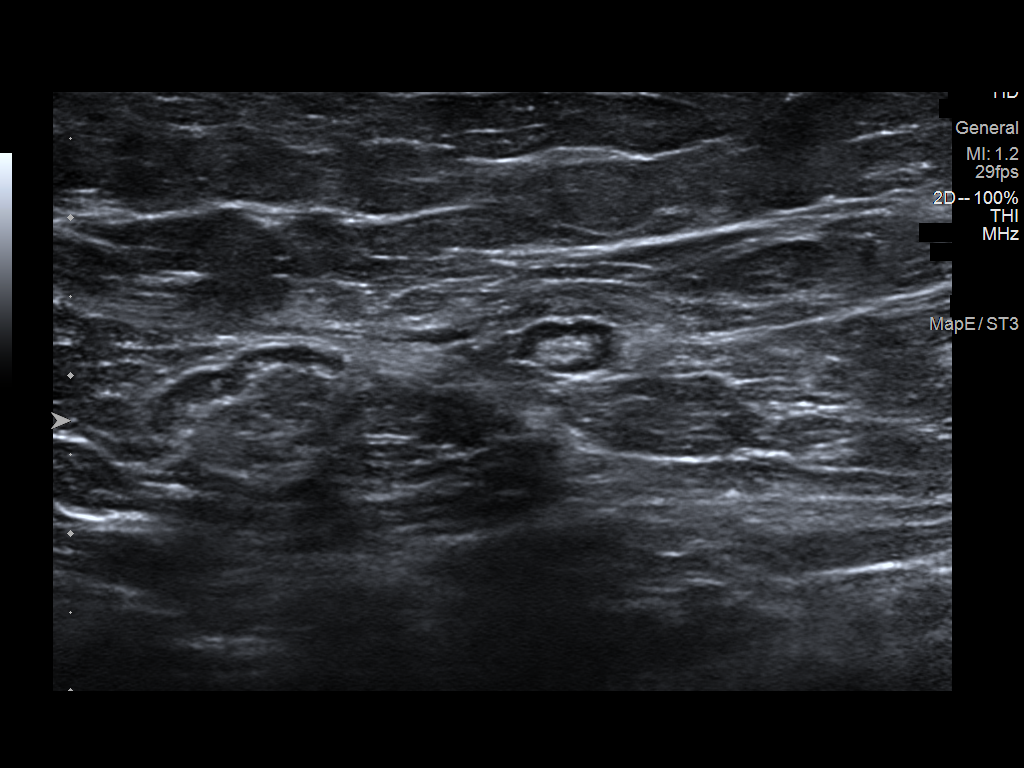
[im 7/7]
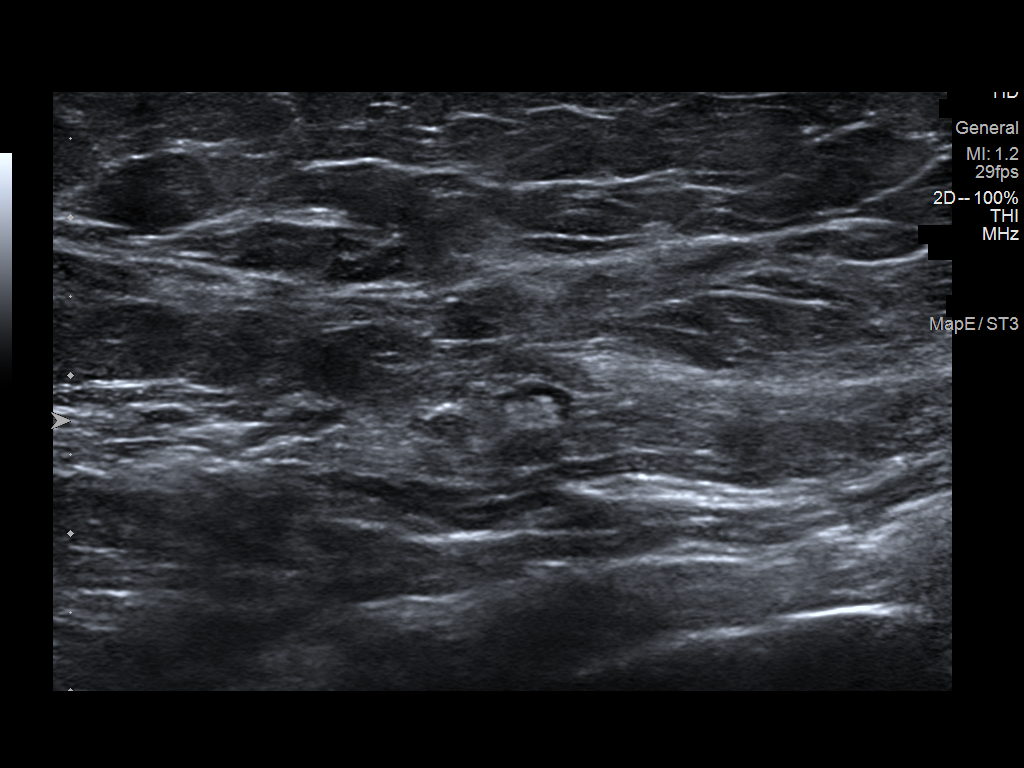

[7 of 7 positions shown; findings below may reference images not displayed]

ACR Breast Density Category c: The breast tissue is heterogeneously
dense, which may obscure small masses.
FINDINGS: Additional imaging of both breast was performed. No distortion, mass
or malignant type microcalcifications identified in either breast.

Targeted ultrasound is performed, showing normal tissue throughout
the central and upper outer quadrants of both breast. Sonographic
evaluation of both axillary region shows no enlarged adenopathy.
IMPRESSION: No evidence of malignancy in either breast.

RECOMMENDATION:
Bilateral screening mammogram in Sunday November, 2021 is recommended.

I have discussed the findings and recommendations with the patient.
If applicable, a reminder letter will be sent to the patient
regarding the next appointment.

BI-RADS CATEGORY  1: Negative.

## 2022-08-12 IMAGING — MG DIGITAL DIAGNOSTIC BILAT W/ TOMO W/ CAD
6 of 12 series · 6 of 36 positions shown · non-contrast
Comparison: Previous exam(s).

CLINICAL DATA: Patient was recalled from screening mammogram for
possible distortion in both breast.



[L MLO synth-2D]
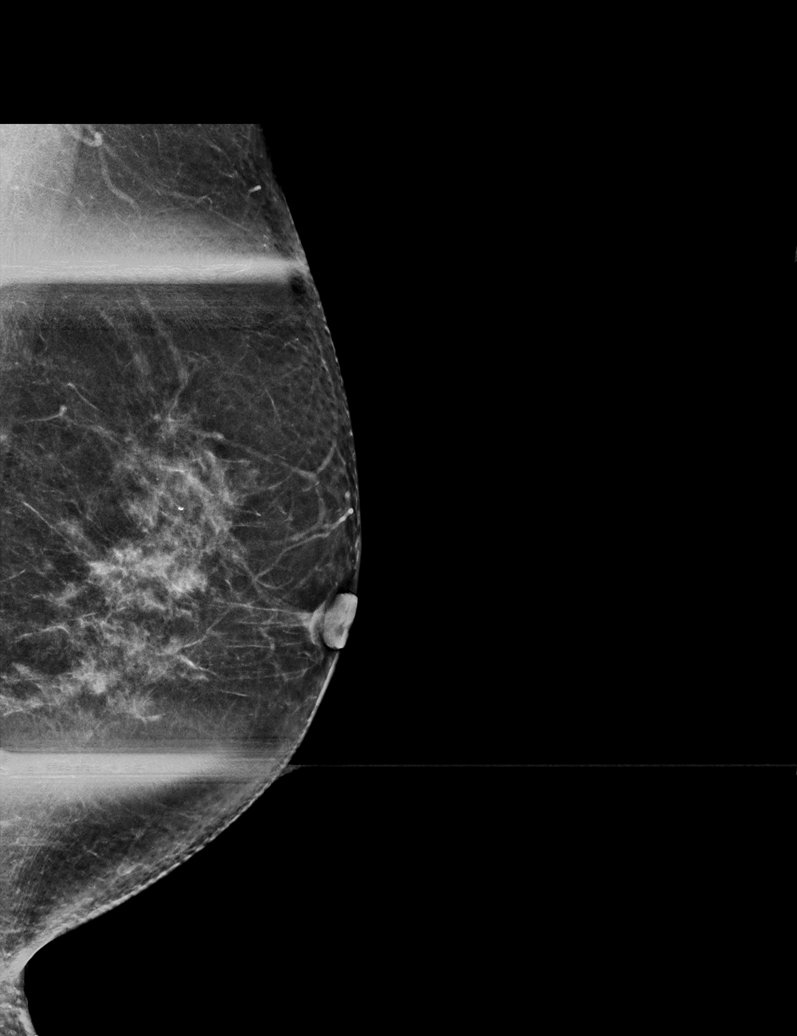

[L CC synth-2D]
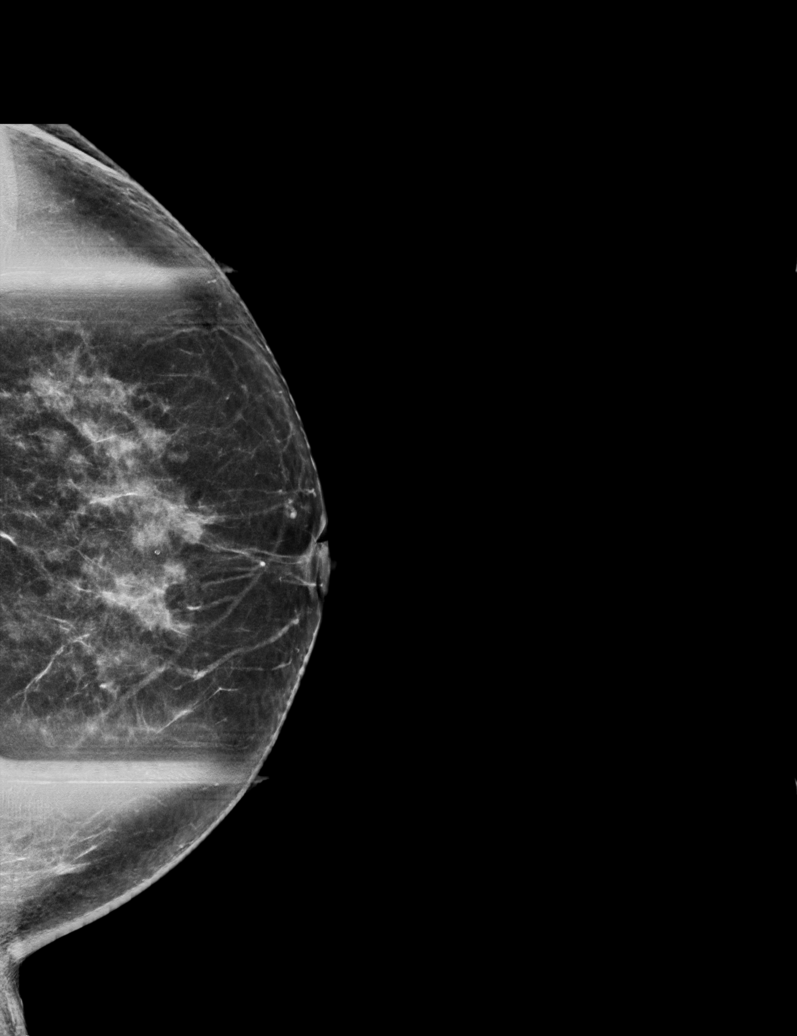

[R CC synth-2D]
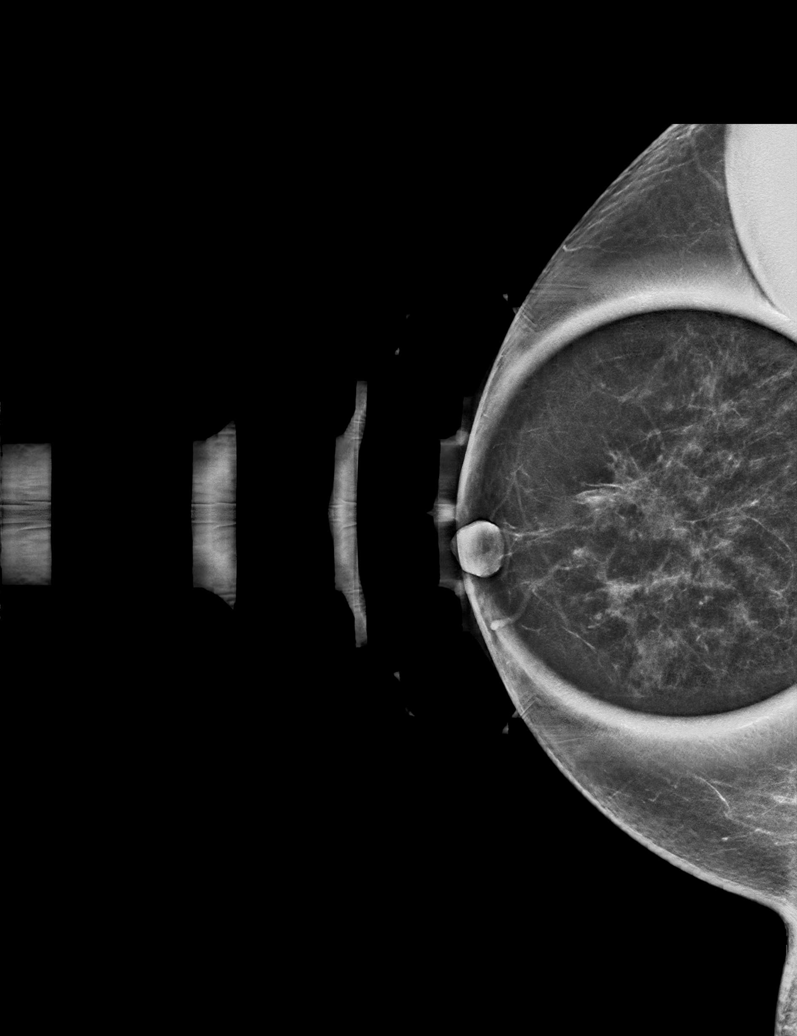

[R ML synth-2D]
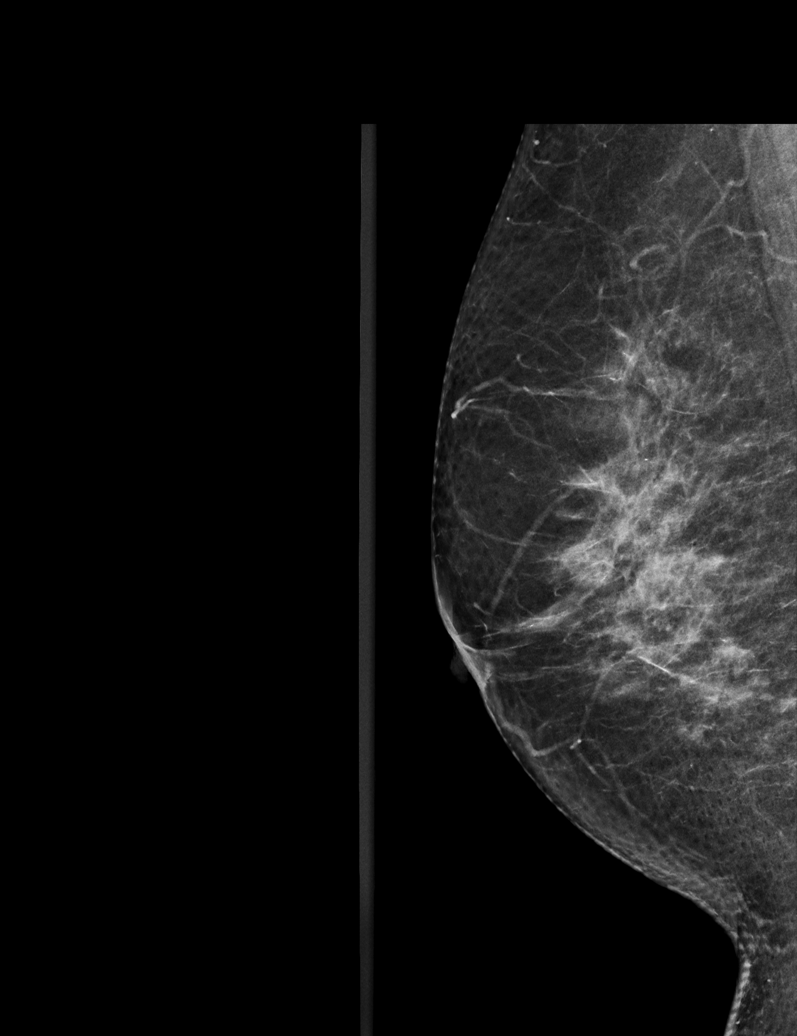

[L ML synth-2D]
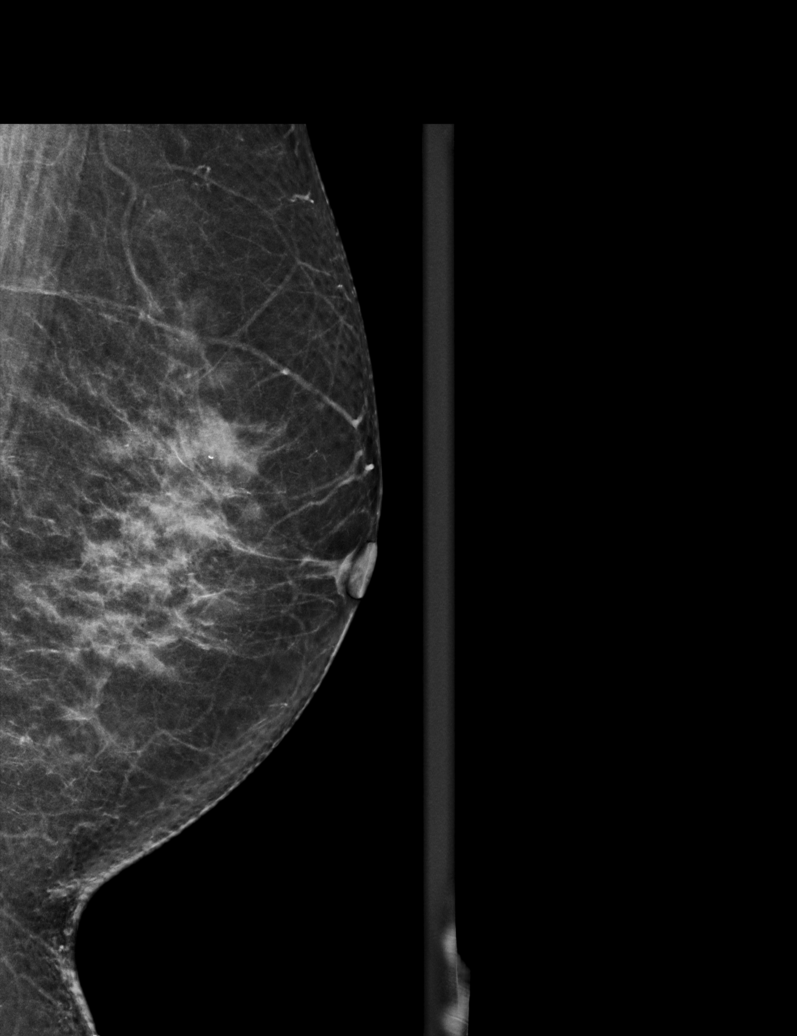

[R MLO synth-2D]
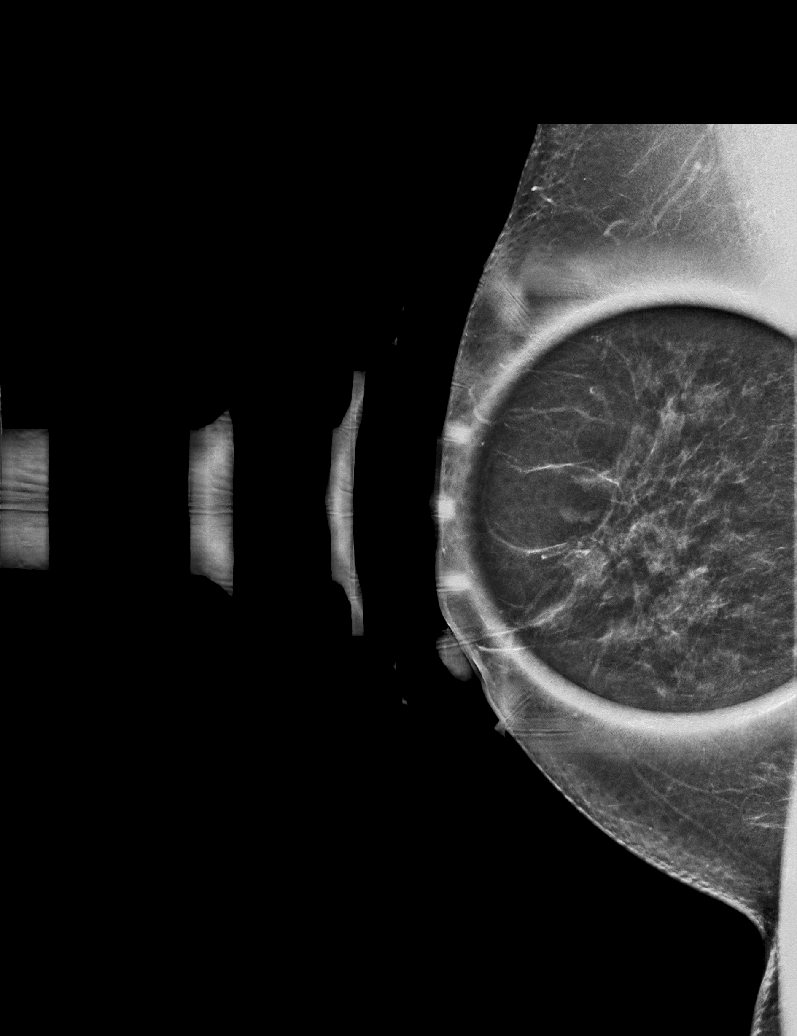

[6 of 36 positions shown; findings below may reference images not displayed]

ACR Breast Density Category c: The breast tissue is heterogeneously
dense, which may obscure small masses.
FINDINGS: Additional imaging of both breast was performed. No distortion, mass
or malignant type microcalcifications identified in either breast.

Targeted ultrasound is performed, showing normal tissue throughout
the central and upper outer quadrants of both breast. Sonographic
evaluation of both axillary region shows no enlarged adenopathy.
IMPRESSION: No evidence of malignancy in either breast.

RECOMMENDATION:
Bilateral screening mammogram in Sunday November, 2021 is recommended.

I have discussed the findings and recommendations with the patient.
If applicable, a reminder letter will be sent to the patient
regarding the next appointment.

BI-RADS CATEGORY  1: Negative.

## 2022-08-17 ENCOUNTER — Telehealth: Payer: Self-pay | Admitting: Internal Medicine

## 2022-08-17 NOTE — Telephone Encounter (Signed)
Patient moved to [place].  

## 2022-10-08 IMAGING — US US ABDOMEN LIMITED
1 series · 14 of 25 positions shown · non-contrast
Comparison: 09/03/2020

CLINICAL DATA: Chronic hepatitis-B

EXAM:
ULTRASOUND ABDOMEN LIMITED RIGHT UPPER QUADRANT

[Series 1: us abdomen limited ruq (liver/gb) · 14 of 44 slices shown]
[im 1/44]
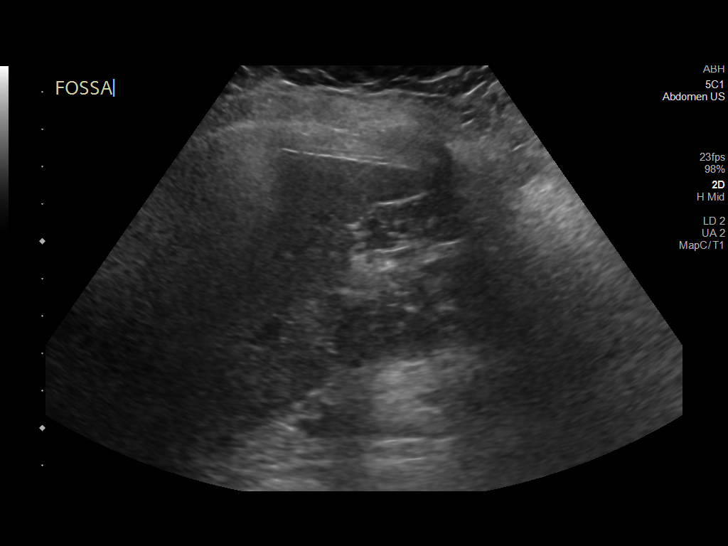
[im 4/44]
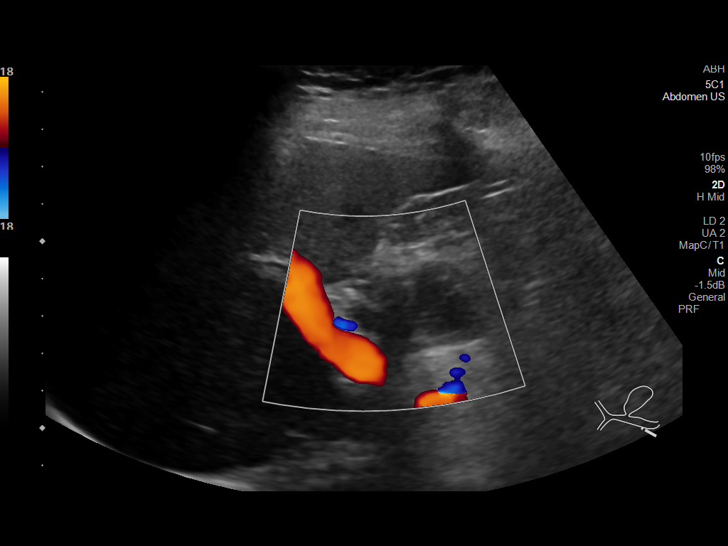
[im 8/44]
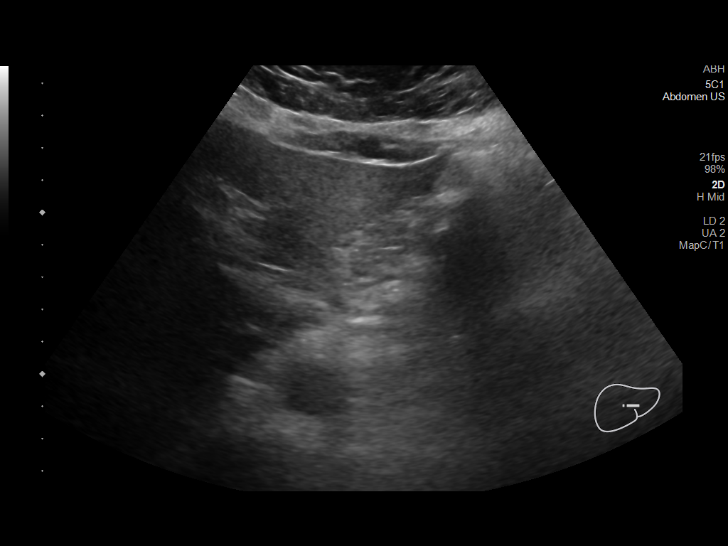
[im 11/44]
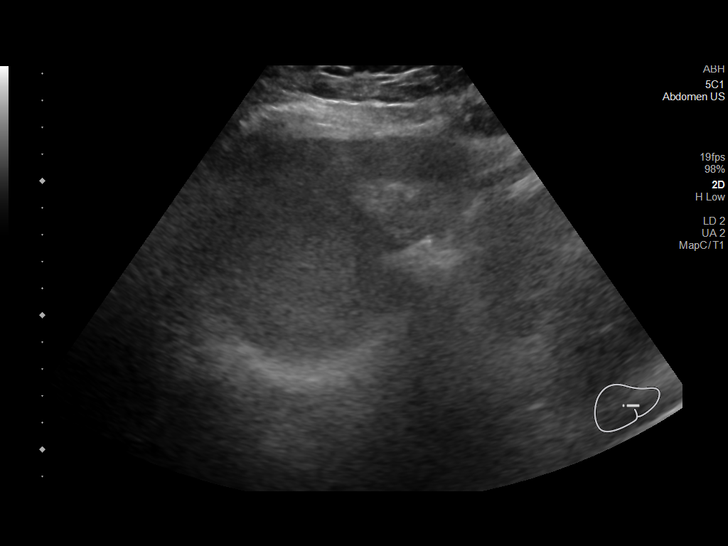
[im 15/44]
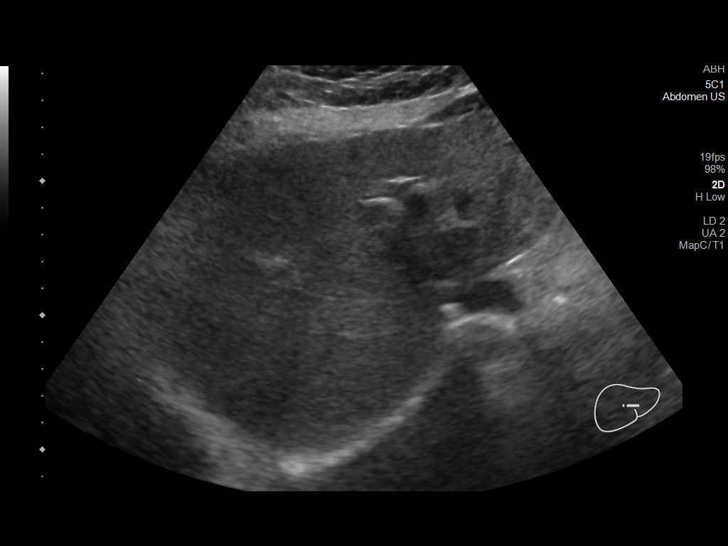
[im 17/44]
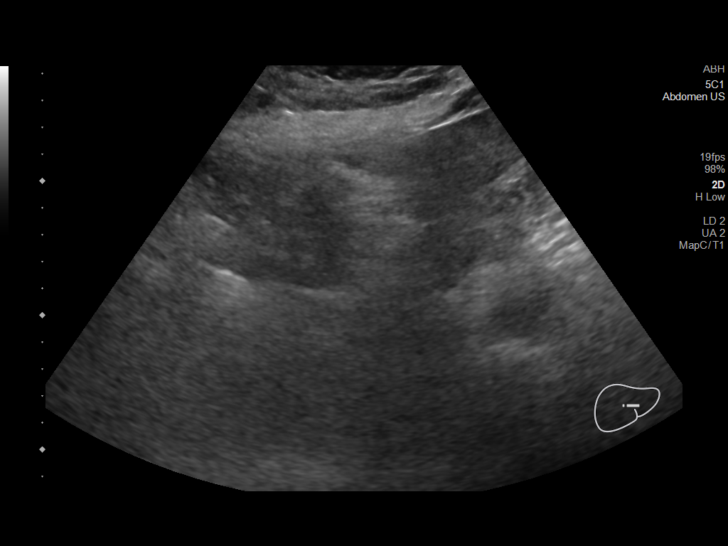
[im 20/44]
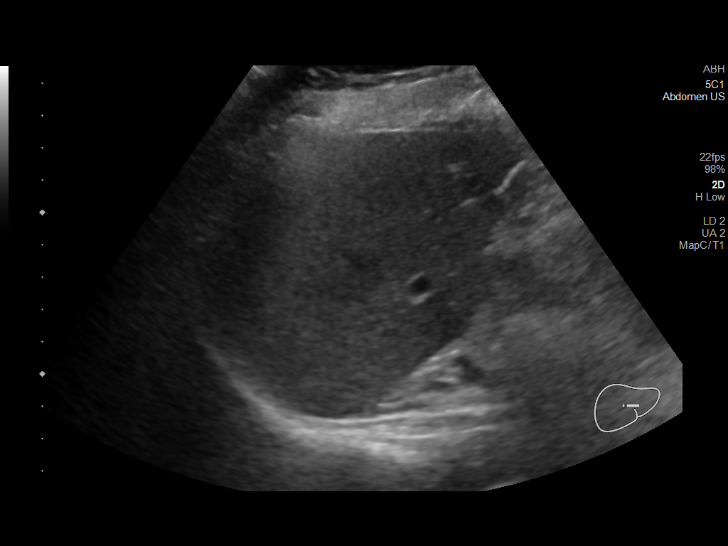
[im 24/44]
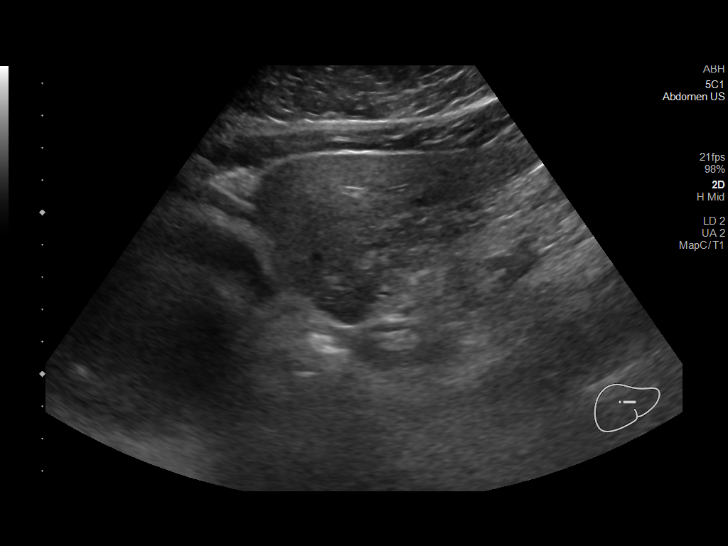
[im 27/44]
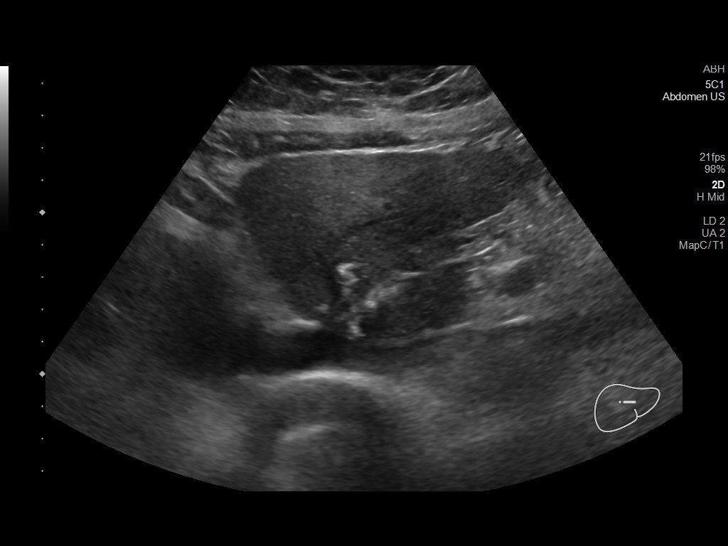
[im 29/44]
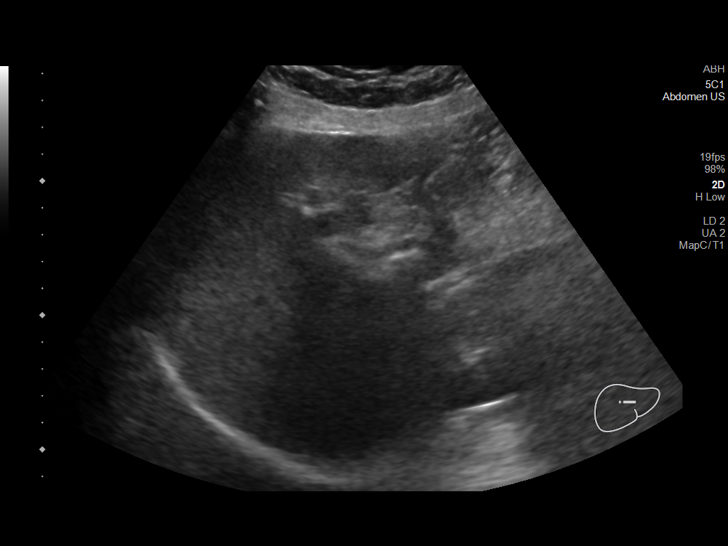
[im 33/44]
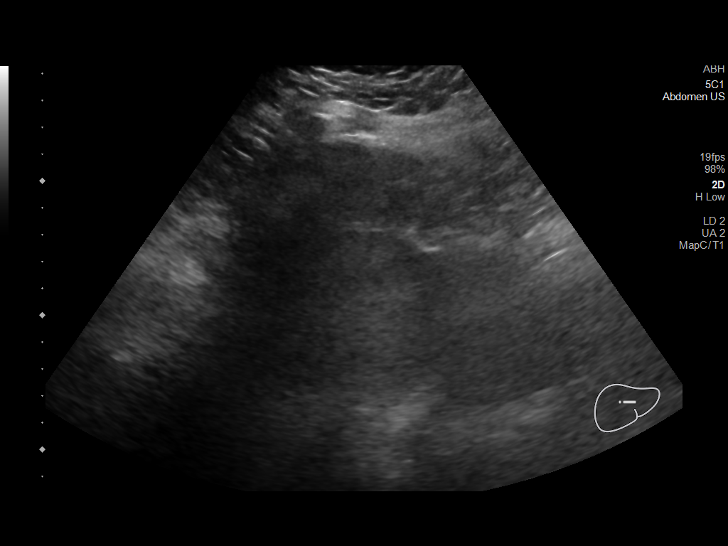
[im 36/44]
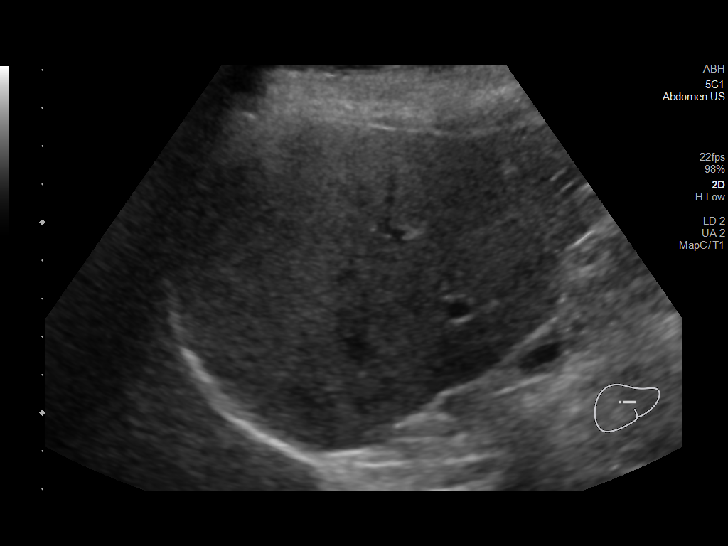
[im 40/44]
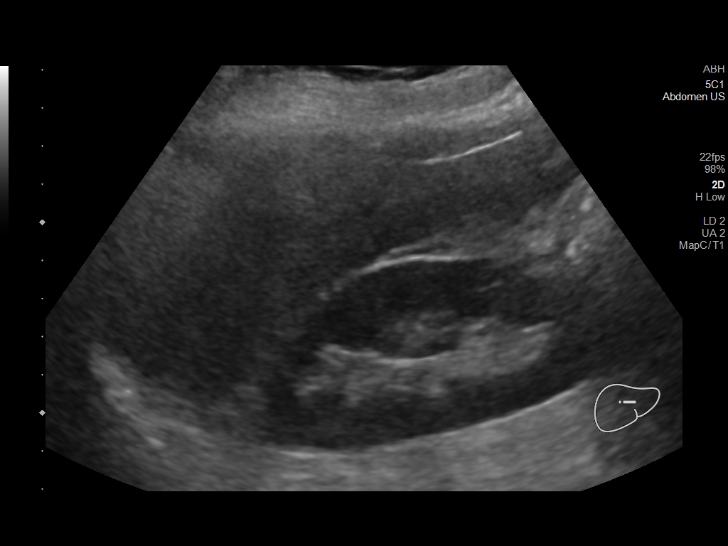
[im 44/44]
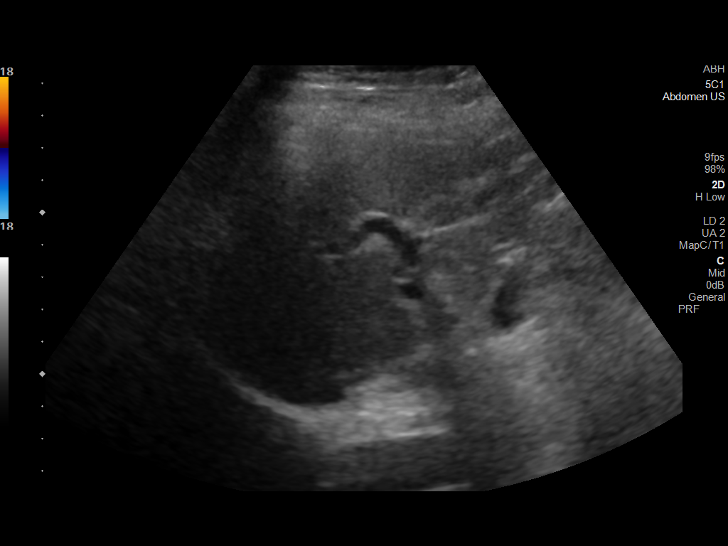

[14 of 25 positions shown; findings below may reference images not displayed]

FINDINGS: Gallbladder:

Surgically removed

Common bile duct:

Diameter: 4 mm.

Liver:

Diffusely heterogeneous and slightly increased in echogenicity
consistent with the given clinical history. No focal mass is noted.
Portal vein is patent on color Doppler imaging with normal direction
of blood flow towards the liver.

Other: None.
IMPRESSION: Parenchymal changes in the liver consistent with the given clinical
history. No focal mass is noted.

Status post cholecystectomy.

## 2023-03-25 IMAGING — US US ABDOMEN LIMITED W/ ELASTOGRAPHY
1 series · 12 of 25 positions shown · non-contrast
Comparison: None Available.

CLINICAL DATA: Chronic hepatitis B

EXAM:
US ABDOMEN LIMITED - RIGHT UPPER QUADRANT
ULTRASOUND HEPATIC ELASTOGRAPHY
TECHNIQUE: Sonography of the right upper quadrant was performed. In addition,
ultrasound elastography evaluation of the liver was performed. A
region of interest was placed within the right lobe of the liver.
Following application of a compressive sonographic pulse, tissue
compressibility was assessed. Multiple assessments were performed at
the selected site. Median tissue compressibility was determined.
Previously, hepatic stiffness was assessed by shear wave velocity.
Based on recently published Society of Radiologists in Ultrasound
consensus article, reporting is now recommended to be performed in
the SI units of pressure (kiloPascals) representing hepatic
stiffness/elasticity. The obtained result is compared to the
published reference standards. (cACLD = compensated Advanced Chronic
Liver Disease)

[Series 1: us abdomen ruq w/elastography · 12 of 40 slices shown]
[im 2/40]
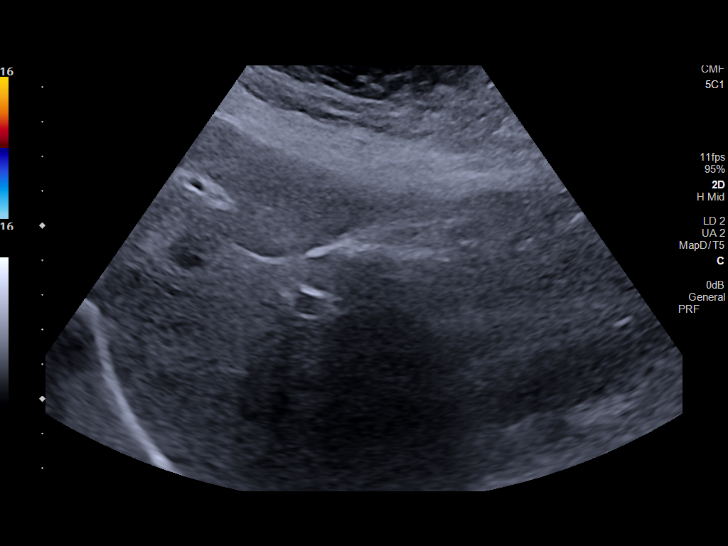
[im 5/40]
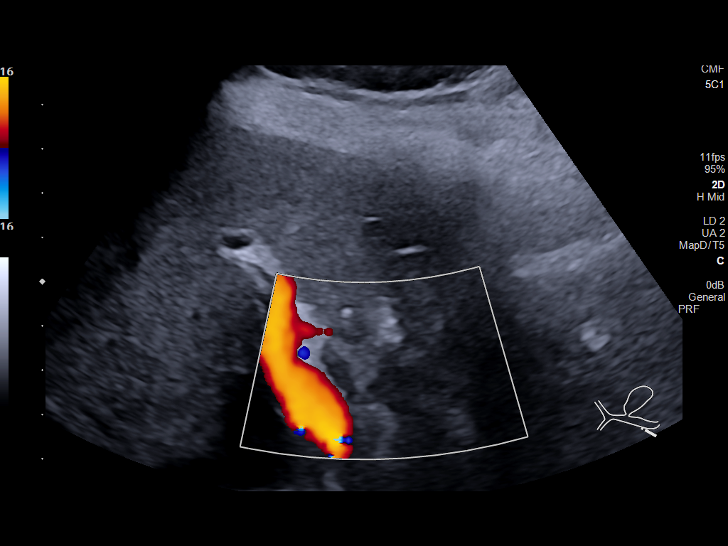
[im 9/40]
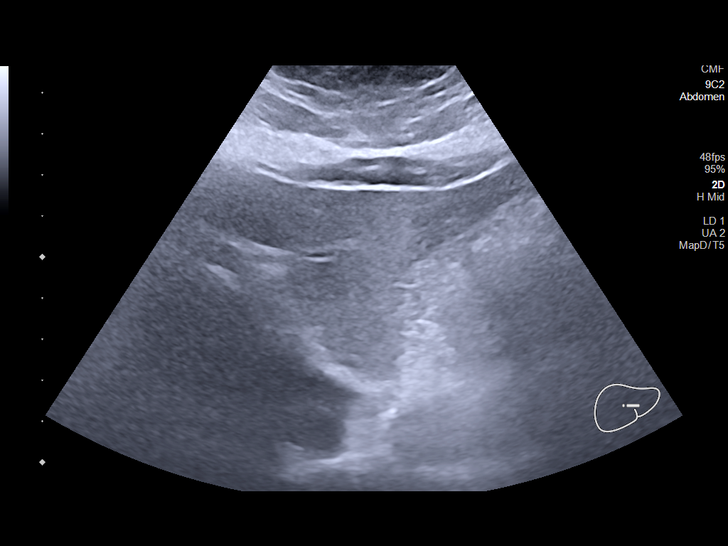
[im 12/40]
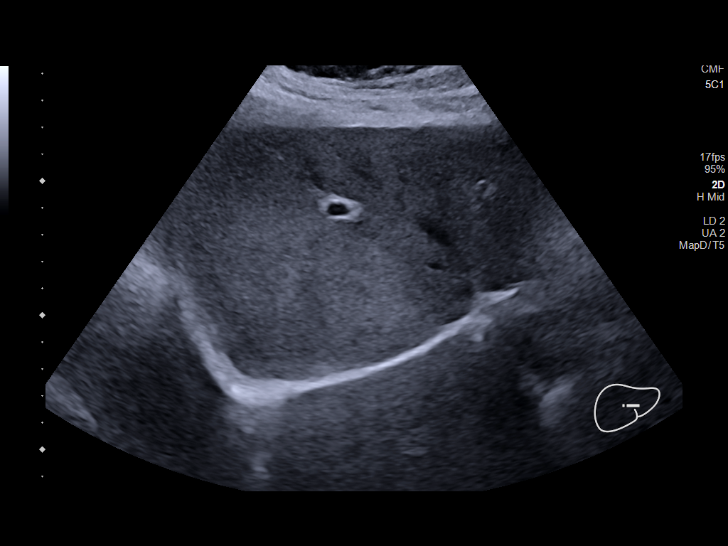
[im 15/40]
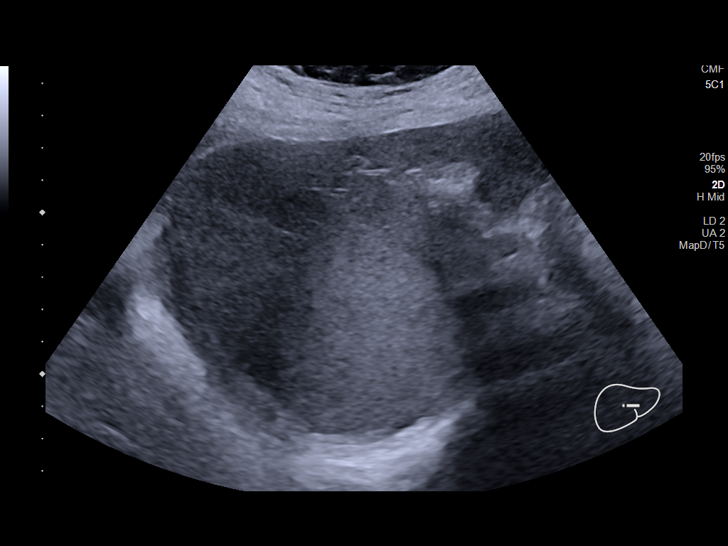
[im 18/40]
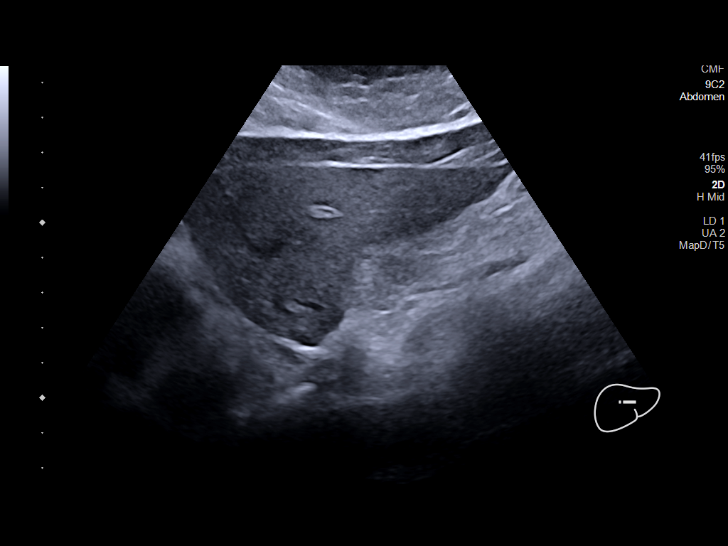
[im 22/40]
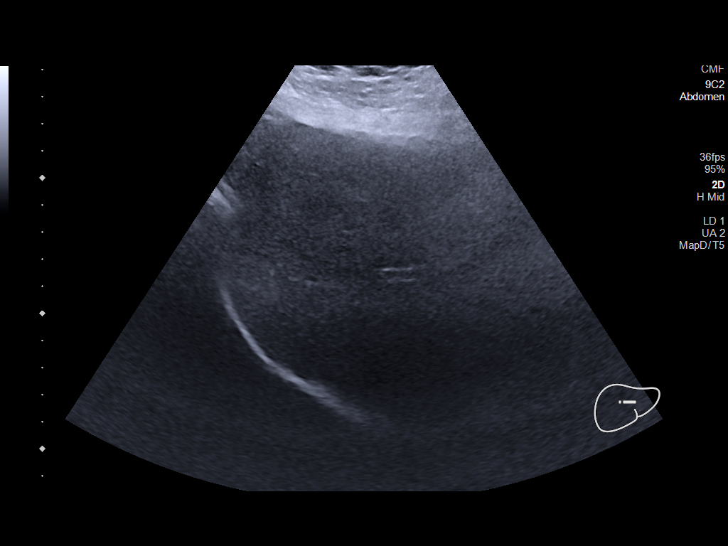
[im 25/40]
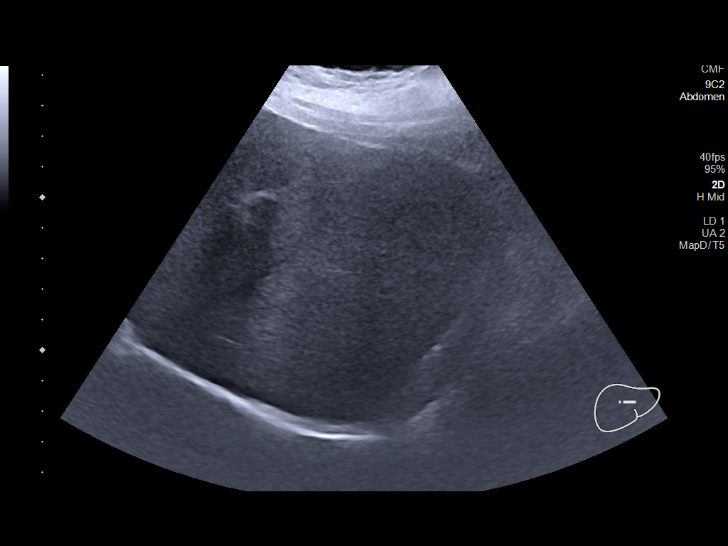
[im 28/40]
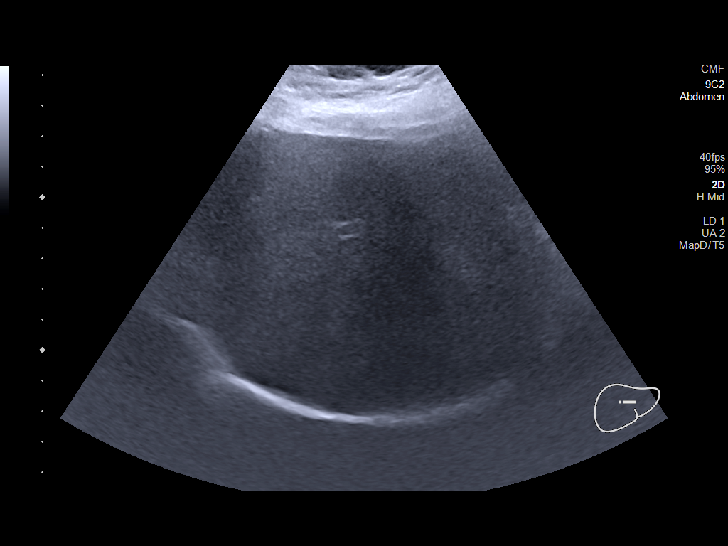
[im 31/40]
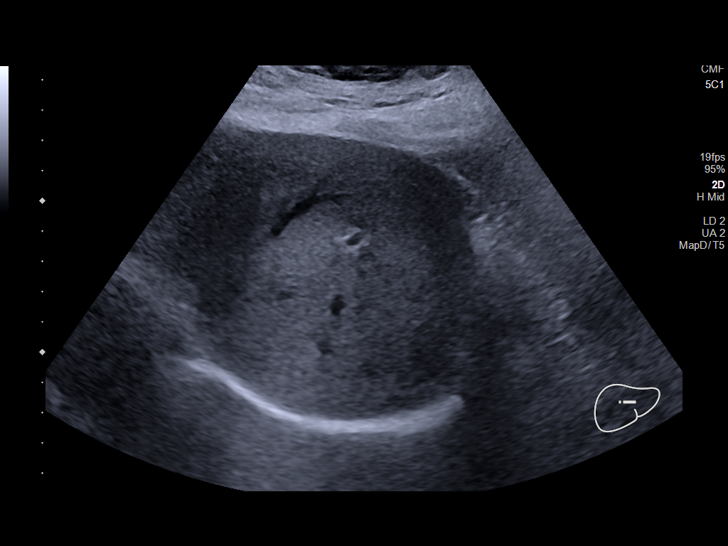
[im 35/40]
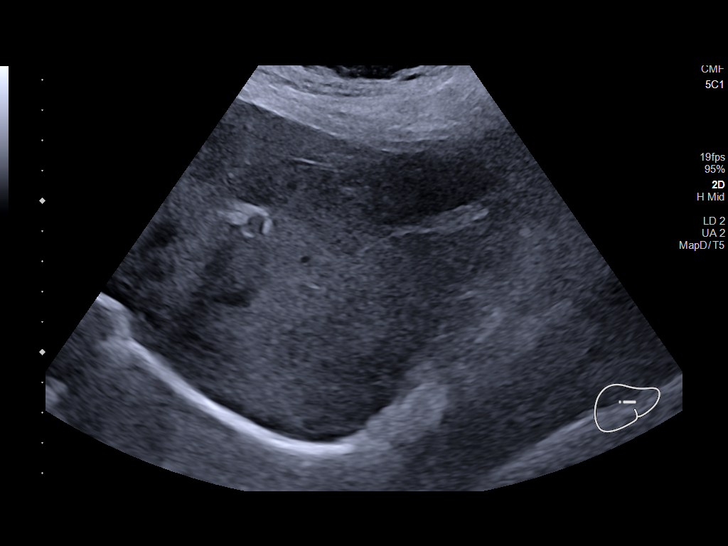
[im 38/40]
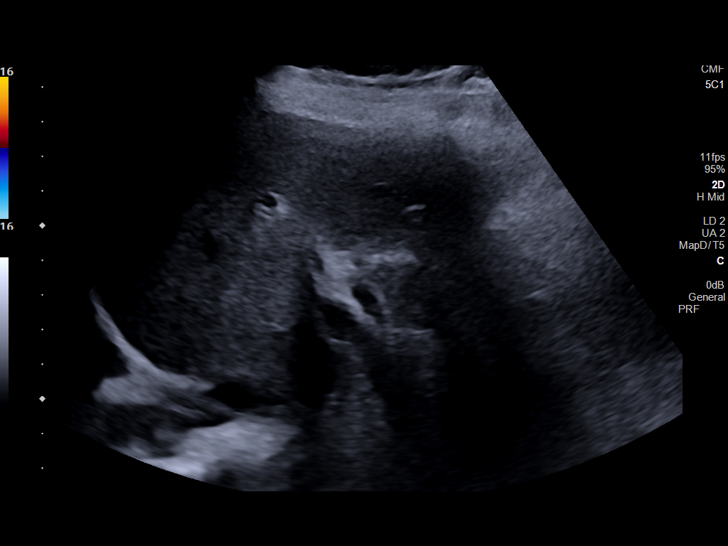

[12 of 25 positions shown; findings below may reference images not displayed]

FINDINGS: ULTRASOUND ABDOMEN LIMITED RIGHT UPPER QUADRANT

Gallbladder:

Status post cholecystectomy.  No sonographic Murphy sign noted.

Common bile duct:

Diameter: 0.5 cm

Liver:

No focal lesion identified. Increased parenchymal echogenicity.
Portal vein is patent on color Doppler imaging with normal direction
of blood flow towards the liver.

ULTRASOUND HEPATIC ELASTOGRAPHY

Device: Siemens Helix VTQ

Patient position: Supine

Transducer C9

Number of measurements: 12

Hepatic segment:  8

Median kPa:

IQR:

IQR/Median kPa ratio:

Data quality: IQR/Median kPa ratio of 0.3 or greater indicates
reduced accuracy

Diagnostic category: < or = 9 kPa: in the absence of other known
clinical signs, rules out cACLD

The use of hepatic elastography is applicable to patients with viral
hepatitis and non-alcoholic fatty liver disease. At this time, there
is insufficient data for the referenced cut-off values and use in
other causes of liver disease, including alcoholic liver disease.
Patients, however, may be assessed by elastography and serve as
their own reference standard/baseline.

In patients with non-alcoholic liver disease, the values suggesting
compensated advanced chronic liver disease (cACLD) may be lower, and
patients may need additional testing with elasticity results of [DATE]
kPa.

Please note that abnormal hepatic elasticity and shear wave
velocities may also be identified in clinical settings other than
with hepatic fibrosis, such as: acute hepatitis, elevated right
heart and central venous pressures including use of beta blockers,
Fuentebella disease ([place]), infiltrative processes such as
mastocytosis/amyloidosis/infiltrative tumor/lymphoma, extrahepatic
cholestasis, with hyperemia in the post-prandial state, and with
liver transplantation. Correlation with patient history, laboratory
data, and clinical condition recommended.

Diagnostic Categories:

< or =5 kPa: high probability of being normal

< or =9 kPa: in the absence of other known clinical signs, rules [DATE] kPa and ?13 kPa: suggestive of cACLD, but needs further testing

>13 kPa: highly suggestive of cACLD

> or =17 kPa: highly suggestive of cACLD with an increased
probability of clinically significant portal hypertension
IMPRESSION: ULTRASOUND RUQ:

Hepatic steatosis.  No focal liver lesion.

ULTRASOUND HEPATIC ELASTOGRAPHY:

Median kPa:

Diagnostic category: < or = 9 kPa: in the absence of other known
clinical signs, rules out cACLD

## 2024-01-11 ENCOUNTER — Ambulatory Visit: Admit: 2024-01-11 | Payer: MEDICARE | Attending: Urology | Primary: Family Medicine

## 2024-01-11 LAB — AMB POC URINALYSIS DIP STICK AUTO W/O MICRO
Blood, Urine, POC: NEGATIVE
Specific Gravity, Urine, POC: 1.03 (ref 1.001–1.035)
pH, Urine, POC: 5.5 (ref 4.6–8.0)

## 2024-01-11 NOTE — Progress Notes (Signed)
 "01/11/2024    Assessment:   Complex right renal cyst 1.4cm with layering calcifications on 06/23/2023 RUQ abd US   -- 08/09/2023 CT abd w/ IV contrast:  Simple right kidney cyst 1cm requiring no further workup. Exophytic left upper pole lesion 7 mm is stable from previous exam likely minimally complicated cyst requiring no further workup.     Right distal ureteral stone 5mm on 12/08/15 CT scan  --S/p right JJ stent placement 12/08/15   --S/p Right ureteroscopy w/ laser litho 01/13/16   -- 08/09/2023 CT abd w/ IV contrast:  No nephrolithiasis or hydronephrosis.     H/o Microhematuria. -- 01/11/2024 UA w/ no blood    06/20/15 S/p Bladder Bx (Dr Arsenio) secondary to 7 mm patch of erythematous bladder mucosa -- no malignancy   -- 08/09/2023 CT abd w/ IV contrast:  No nephrolithiasis or hydronephrosis.     Plan:     RTC PRN         CC: Kidney cyst    Darlene Espinoza is a 68 y.o. Filipino female sent to me by Anton Raoul HERO, MD for evaluation of Kidney cyst on Abd US .  Denies any flank / abdominal pain  2017 Pt seen for Kidney stones.     H/o Breast CA - s/p bilat mastectomy April 2024    Currently doing well - has h/o Left sciatica    No voiding difficulty or c/o   No Dysuria / Hematuria    11/01/2023 MRI abdomen:  1. Definitive characterization of hepatic and renal lesions is not possible without IV contrast. However there are several small T2 hyperintense lesions in both kidneys and the liver. Statistically these most likely represent benign cysts despite the inability to fully characterize these lesions on this exam.   2. A 1.0 cm lesion in the upper pole left kidney which does not demonstrate noncontrast imaging characteristics typical of a simple cyst. This lesion could represent a hemorrhagic cyst or solid renal neoplasm. Only an examination with and without contrast could further characterize.     08/09/2023 CT abd w/ IV contrast:  No nephrolithiasis or hydronephrosis.   Few hypoenhancing liver lesions likely simple hepatic cysts  some of which were mentioned on previous ultrasounds. Consider further evaluation with nonemergent MRI abdomen for confirmation.   Bilateral renal lesions are stable most likely combination of simple and minimally complicated cysts.   Simple cyst in the right kidney requiring no further workup measuring 1 cm. Exophytic left upper pole lesion measuring 7 mm is stable from previous exam likely minimally complicated cyst requiring no further workup.     06/23/2023 RUQ abd US :  Complex right renal cyst 1.4cm with layering calcifications appears slightly larger in size compared to prior studies -- was 1.0 cm on 12/17/2022 and 0.9 cm on 01/07/2022. This could be better characterized with a renal protocol MRI to exclude a solid/enhancing component.     12/17/2022 RUQ abd US :   RIGHT KIDNEY: Measures 9.2 x 4.5 x 4.1 cm. Mildly complex cyst with layering calcifications measuring up to 1.0 cm. No nephrolithiasis or hydronephrosis.     From 02/26/2016 visit w/ Dr Marshia:  Darlene Espinoza is a 68 y.o. Filipino female her for f/u of Right distal ureteral stone 5mm   She is S/p Cysto, right JJ stent placement 12/08/15   01/13/2016 S/p Right ureteroscopy w/ laser litho   FINDINGS: Right distal ureteral stone 5mm     >>She reports doing well overall since stent removal. No bothersome voiding  issues since surgery/stent removal.   No abd or flank pain. No f/c/n/v.     Pt previously seen by Dr. Arsenio for microhematuria.   06/20/15 S/p negative Bladder Bx secondary to 7 mm patch of erythematous bladder mucosa seen on 05/07/15 Cysto for microhematuria    Family h/o stones, Brother  She reports family h/o gallstones (brother and sister)    12/08/15 CT Abd/pelvis:  IMPRESSION:   5 x 4 mm obstructing calculus in the distal right ureter just proximal to the UVJ resulting in moderate hydronephroureter.  Subcentimeter iso-to slightly hyperdense left upper pole renal lesion.  Solid nodule versus complex cyst. Ultrasound may help further  characterize.  Subcentimeter hepatic hypodensity, too small to further characterize.  Small hiatal hernia.  Large gallstone, 2cm.  Incidental 4 mm right basilar pulmonary nodule.  A chest CT can be obtained on a nonemergent basis.     PMH/PSH/SocHx/FamHx/Meds & allergies reviewed.   Past Medical History:   Diagnosis Date    Constipation     Gallstones 01/29/2015    Hematuria     Hemorrhoid     Hypercholesterolemia     Kidney stone     Microscopic hematuria     PONV (postoperative nausea and vomiting)      Past Surgical History:   Procedure Laterality Date    COLONOSCOPY      HYSTERECTOMY (CERVIX STATUS UNKNOWN)      UROLOGICAL SURGERY  12/08/2015    CYSTOURETHROSCOPY WITH INDWELLING URETERAL STENT INSERTION; RETROGRADE; Surgeon: Marshia Coy, MD    UROLOGICAL SURGERY  06/20/2015    CHG.  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  Dr. Arsenio.       Social History     Socioeconomic History    Marital status: Married     Spouse name: Not on file    Number of children: Not on file    Years of education: Not on file    Highest education level: Not on file   Occupational History    Not on file   Tobacco Use    Smoking status: Never    Smokeless tobacco: Never   Substance and Sexual Activity    Alcohol use: No     Alcohol/week: 0.0 standard drinks of alcohol    Drug use: No    Sexual activity: Not on file   Other Topics Concern    Not on file   Social History Narrative    ** Merged History Encounter **          Social Drivers of Health     Financial Resource Strain: Low Risk  (10/01/2023)    Received from [facility]    Overall Financial Resource Strain (CARDIA)     Difficulty of Paying Living Expenses: Not hard at all   Food Insecurity: No Food Insecurity (10/01/2023)    Received from [facility]    Hunger Vital Sign     Within the past 12 months, you worried that your food would run out before you got the money to buy more.: Never true     Within the past 12  months, the food you bought just didn't last and you didn't have money to get more.: Never true   Transportation Needs: No Transportation Needs (10/01/2023)    Received from [facility] - Transportation     Lack of Transportation (Medical): No     Lack of Transportation (Non-Medical): No   Physical  Activity: Insufficiently Active (10/01/2023)    Received from [facility]    Exercise Vital Sign     On average, how many days per week do you engage in moderate to strenuous exercise (like a brisk walk)?: 3 days     On average, how many minutes do you engage in exercise at this level?: 30 min   Stress: Patient Declined (10/01/2023)    Received from [facility] - Occupational Stress Questionnaire     Feeling of Stress : Patient declined   Social Connections: Unknown (10/01/2023)    Received from [facility]    Social Connection and Isolation Panel     In a typical week, how many times do you talk on the phone with family, friends, or neighbors?: Three times a week     How often do you get together with friends or relatives?: Once a week     How often do you attend church or religious services?: Patient declined     Do you belong to any clubs or organizations such as church groups, unions, fraternal or athletic groups, or school groups?: No     How often do you attend meetings of the clubs or organizations you belong to?: Never     Are you married, widowed, divorced, separated, never married, or living with a partner?: Married   Intimate Partner Violence: Not At Risk (10/01/2023)    Received from [facility]    Humiliation, Afraid, Rape, and Kick questionnaire     Within the last year, have you been afraid of your partner or ex-partner?: No     Within the last year, have you been humiliated or emotionally abused in other ways by your partner or ex-partner?: No     Within the last year, have you been kicked, hit, slapped, or otherwise physically hurt by your  partner or ex-partner?: No     Within the last year, have you been raped or forced to have any kind of sexual activity by your partner or ex-partner?: No   Housing Stability: Low Risk  (10/01/2023)    Received from [facility] Stability Vital Sign     In the last 12 months, was there a time when you were not able to pay the mortgage or rent on time?: No     In the past 12 months, how many times have you moved where you were living?: 0     At any time in the past 12 months, were you homeless or living in a shelter (including now)?: No     Family History   Problem Relation Age of Onset    Diabetes Father     Gout Father     Breast Cancer Sister     Elevated Lipids Father     Hypertension Mother      Allergies   Allergen Reactions    Alcohol Rash    Isosulfan Blue Hives    Dermabond Rash     Prineo severe skin rxn.     Current Outpatient Medications on File Prior to Visit   Medication Sig Dispense Refill    ascorbic acid (VITAMIN C) 500 MG tablet Take 1 tablet by mouth daily      atorvastatin (LIPITOR) 20 MG tablet 1 tab(s) orally once a day; Duration: 30 day(s)      gabapentin (NEURONTIN) 100 MG capsule Take 2 capsules by mouth.      lidocaine (  LIDODERM) 5 % Apply 1 patch as directed for 12 hours every 24 hours (12 hours on, 12 hours off)      Multiple Vitamin (MULTIVITAMIN) tablet Take 1 tablet by mouth daily      Omega-3 Fatty Acids (FISH OIL) 1000 MG capsule Take 1 capsule by mouth daily      triamcinolone (KENALOG) 0.1 % ointment       methylPREDNISolone (MEDROL DOSEPACK) 4 MG tablet       pregabalin (LYRICA) 50 MG capsule Take 1 capsule by mouth 2 times daily.      VEMLIDY 25 MG TABS       Cinnamon 500 MG CAPS Take by mouth      hydrocortisone 2.5 % cream Place rectally 3 times daily as needed      ALPRAZolam (XANAX) 0.25 MG tablet  (Patient not taking: Reported on 01/11/2024)      DULoxetine (CYMBALTA) 30 MG extended release capsule TAKE 1 CAPSULE BY MOUTH DAILY FOR NERVE PAIN      naproxen  (NAPROSYN) 500 MG tablet Take 1 tablet by mouth 2 times daily (Patient not taking: Reported on 01/11/2024)      alendronate (FOSAMAX) 35 MG tablet TK 1 T PO WEEKLY (Patient not taking: Reported on 01/11/2024)      vitamin D 25 MCG (1000 UT) CAPS Take by mouth      HYDROcodone-acetaminophen (NORCO) 5-325 MG per tablet Take 1 tablet by mouth every 6 hours as needed.      oxybutynin (DITROPAN) 5 MG tablet TAKE 1 TABLET BY MOUTH TWICE DAILY FOR BLADDER SPASMS OR URINARY FREQUENCY (Patient not taking: Reported on 01/11/2024)      simvastatin (ZOCOR) 20 MG tablet Take 10 mg by mouth every other day       No current facility-administered medications on file prior to visit.       Urinalysis:   Results for orders placed or performed in visit on 01/11/24   AMB POC URINALYSIS DIP STICK AUTO W/O MICRO   Result Value Ref Range    Color, Urine, POC Dark Yellow     Clarity, Urine, POC Clear     Glucose, Urine, POC Negative     Bilirubin, Urine, POC Negative     Ketones, Urine, POC Negative     Specific Gravity, Urine, POC 1.030 1.001 - 1.035    Blood, Urine, POC Negative Negative    pH, Urine, POC 5.5 4.6 - 8.0    Protein, Urine, POC Negative     Urobilinogen, POC 0.2 mg/dL     Nitrite, Urine, POC Negative     Leukocyte Esterase, Urine, POC 1+        Physical Exam:  Resp 16   Ht 1.6 m (5' 3)   Wt 60.8 kg (134 lb)   BMI 23.74 kg/m   Constitutional: WDWN, appropriate affect, No acute distress.    Respiratory: No respiratory distress or difficulties  Skin: No jaundice.    MSK: FROM  Neuro/Psych:  Alert and oriented x 3. Affect appropriate.       Maude MALVA Quale, MD      Cc:  Anton Raoul HERO, MD     "
